# Patient Record
Sex: Female | Born: 1995 | Race: White | Hispanic: No | Marital: Single | Smoking: Never smoker
Health system: Southern US, Community
[De-identification: ages and names within clinical notes are randomized; demographics above are authoritative.]

## PROBLEM LIST (undated history)

## (undated) ENCOUNTER — Inpatient Hospital Stay (HOSPITAL_COMMUNITY): Payer: Self-pay

## (undated) DIAGNOSIS — F329 Major depressive disorder, single episode, unspecified: Secondary | ICD-10-CM

## (undated) DIAGNOSIS — F419 Anxiety disorder, unspecified: Secondary | ICD-10-CM

## (undated) DIAGNOSIS — R519 Headache, unspecified: Secondary | ICD-10-CM

## (undated) DIAGNOSIS — F32A Depression, unspecified: Secondary | ICD-10-CM

## (undated) DIAGNOSIS — J45909 Unspecified asthma, uncomplicated: Secondary | ICD-10-CM

## (undated) DIAGNOSIS — R51 Headache: Secondary | ICD-10-CM

## (undated) HISTORY — PX: WISDOM TOOTH EXTRACTION: SHX21

## (undated) HISTORY — DX: Anxiety disorder, unspecified: F41.9

## (undated) HISTORY — DX: Depression, unspecified: F32.A

## (undated) HISTORY — DX: Headache, unspecified: R51.9

## (undated) HISTORY — DX: Headache: R51

---
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
## 2013-04-08 NOTE — L&D Delivery Note (Signed)
Delivery Note  First Stage: Labor onset: 0500 Augmentation : pitocin Analgesia /Anesthesia intrapartum: epidural SROM at 0915  Second Stage: Complete dilation at 1136 Onset of pushing at 1145 FHR second stage 135 with few variables to 110  Delivery of a viable female at [id number] by CNM in ROA - ROP position no nuchal cord Cord double clamped after cessation of pulsation, cut by FOB Cord blood sample collected    Third [facility] @ 1248 Placenta disposition: disposal Uterine tone firm / bleeding small  Vaginal midline and right sulcus laceration identified  Anesthesia for repair: epidural Repair 3-0 vicrl vaginal locked repair Est. Blood Loss (mL): 300  Complications: none  Mom to postpartum.  Baby to Couplet care / Skin to Skin.  Newborn: Birth Weight: 8 pound 7 ounces Apgar Scores: 8-9 Feeding planned: breast  Marlinda Mikeanya Bailey CNM, MSN, [facility] 07/15/2013, 7:12 PM

## 2013-06-15 LAB — OB RESULTS CONSOLE GC/CHLAMYDIA
Chlamydia: NEGATIVE
Gonorrhea: NEGATIVE

## 2013-06-28 ENCOUNTER — Inpatient Hospital Stay (HOSPITAL_COMMUNITY)
Admission: AD | Admit: 2013-06-28 | Discharge: 2013-06-29 | Disposition: A | Discharge status: Home / Self Care | Payer: 59 | Source: Ambulatory Visit | Attending: Obstetrics & Gynecology | Admitting: Obstetrics & Gynecology

## 2013-06-28 ENCOUNTER — Encounter (HOSPITAL_COMMUNITY): Payer: Self-pay | Admitting: *Deleted

## 2013-06-28 ENCOUNTER — Inpatient Hospital Stay (HOSPITAL_COMMUNITY): Payer: 59

## 2013-06-28 DIAGNOSIS — O9A213 Injury, poisoning and certain other consequences of external causes complicating pregnancy, third trimester: Secondary | ICD-10-CM

## 2013-06-28 DIAGNOSIS — Y929 Unspecified place or not applicable: Secondary | ICD-10-CM | POA: Insufficient documentation

## 2013-06-28 DIAGNOSIS — W108XXA Fall (on) (from) other stairs and steps, initial encounter: Secondary | ICD-10-CM | POA: Insufficient documentation

## 2013-06-28 DIAGNOSIS — O479 False labor, unspecified: Secondary | ICD-10-CM | POA: Insufficient documentation

## 2013-06-28 DIAGNOSIS — O368190 Decreased fetal movements, unspecified trimester, not applicable or unspecified: Secondary | ICD-10-CM | POA: Insufficient documentation

## 2013-06-28 DIAGNOSIS — R109 Unspecified abdominal pain: Secondary | ICD-10-CM | POA: Insufficient documentation

## 2013-06-28 DIAGNOSIS — O99891 Other specified diseases and conditions complicating pregnancy: Secondary | ICD-10-CM | POA: Insufficient documentation

## 2013-06-28 DIAGNOSIS — O9989 Other specified diseases and conditions complicating pregnancy, childbirth and the puerperium: Secondary | ICD-10-CM

## 2013-06-28 MED ORDER — ACETAMINOPHEN 500 MG PO TABS
1000.0000 mg | ORAL_TABLET | Freq: Once | ORAL | Status: AC
  Administered 2013-06-28: 1000 mg via ORAL
  Filled 2013-06-28: qty 2

## 2013-06-28 NOTE — MAU Note (Signed)
Pt reports falling down stairs at about 2030, mostly on her bottom but also on right side. Denies bleeding, reports one fetal movement since fall. States she is having cramping now since the fall.

## 2013-06-28 NOTE — MAU Note (Signed)
PT SAYS SHE  FELL DOWN  3-4 STEPS TONIGHT -  CAT RAN BETWEEN  HER LEGS.     SHE LANDED TOWARD HER LEFT SIDE-.   VE IN OFFICE ON Thursday 1 CM.   DENIES HSV AND MRSA.

## 2013-06-28 NOTE — MAU Provider Note (Signed)
History     CSN: [id number]  Arrival date and time: 06/28/13 2132 Provider notified: 2210 Provider on unit: 2305 Provider at bedside: 2310     Chief Complaint  Patient presents with  . Fall  . Contractions   HPI  Ms. Emily Santos is a 18 yo female G1P0 at 37.[redacted] wks gestation presenting after a fall down 3-4 stairs d/t the   cat making her trip while on the stairs. She fell on her bottom, then hit her back and lastly landing on left side.  Denies VB or LOF. Some decreased FM immediately after the fall, but increased/normal FM shortly afterwards. She reports her contractions started earlier in the day every 10-12 minutes, stopped for a while, then started  back more frequently after the fall.    History reviewed. No pertinent past medical history.  Past Surgical History  Procedure Laterality Date  . Wisdom tooth extraction      History reviewed. No pertinent family history.  History  Substance Use Topics  . Smoking status: Former Games developermoker  . Smokeless tobacco: Not on file  . Alcohol Use: No    Allergies:  Allergies  Allergen Reactions  . Sulfa Antibiotics Rash    Prescriptions prior to admission  Medication Sig Dispense Refill  . acetaminophen (TYLENOL) 325 MG tablet Take 650 mg by mouth every 6 (six) hours as needed for headache.      . calcium carbonate (TUMS - DOSED IN MG ELEMENTAL CALCIUM) 500 MG chewable tablet Chew 2 tablets by mouth daily as needed for indigestion or heartburn.      . cetirizine (ZYRTEC) 10 MG tablet Take 10 mg by mouth daily.      . Multiple Vitamin (MULTIVITAMIN) tablet Take 1 tablet by mouth daily. Gummy prenatal vitamin      . ranitidine (ZANTAC) 150 MG capsule Take 150 mg by mouth 2 (two) times daily.        Review of Systems  Constitutional: Negative.   HENT: Negative.   Eyes: Negative.   Respiratory: Negative.   Cardiovascular: Negative.   Gastrointestinal: Negative.   Musculoskeletal: Positive for back pain.  Skin: Negative.    Neurological: Negative.   Endo/Heme/Allergies: Negative.   Psychiatric/Behavioral: Negative.    CEFM (Prolonged x 4 hours) FHR: 130 / moderate variability / accels present / rare variables TOC: irregular UC's every 3-8 mins / pt able to talk through 90% of contractions  OB Limited U/S Cephalic / FHR 126 bpm / AFI level 11.74 / Placenta posterior above cervical os - no evidence of an abruption seen / cervix not visualized transabdominally   Physical Exam   Blood pressure 129/75, pulse 92, temperature 98.3 F (36.8 C), temperature source Oral, resp. rate 18, height 5' 7.5" (1.715 m), weight 95.255 kg (210 lb), SpO2 99.00%.  Physical Exam  Constitutional: She is oriented to person, place, and time. She appears well-developed and well-nourished.  HENT:  Head: Normocephalic and atraumatic.  Eyes: Pupils are equal, round, and reactive to light.  Neck: Normal range of motion. Neck supple.  Cardiovascular: Normal rate, regular rhythm and normal heart sounds.   Respiratory: Effort normal and breath sounds normal.  GI: Soft. Bowel sounds are normal.  Genitourinary: Uterus normal.  Musculoskeletal: Normal range of motion.  Neurological: She is alert and oriented to person, place, and time.  Skin: Skin is warm and dry.  Psychiatric: She has a normal mood and affect. Her behavior is normal. Judgment and thought content normal.    MAU  Course  Procedures Prolonged EFM (4 hours) OB Limited U/S - check placenta Tylenol 1000 mg po Po hydration  Assessment and Plan  18 yo SIUP @ 37.[redacted] wks gestation S/P Fall in pregnancy  Discharge Home Bleeding Precautions Labor Precautions Fetal Kick Counts Keep scheduled appointment with [facility] on 06/30/13 Call the office prn  *Dr. Seymour Bars notified of assessment & plan - agrees  Kenard Gower, MSN, CNM 06/28/2013, 11:19 PM

## 2013-06-29 NOTE — Discharge Instructions (Signed)
Placental Abruption Placental abruption is when the placenta partially or completely separates from the uterus before the baby (fetus) is born. The placenta is the organ that provides nourishment to the baby. Normally, the placenta does not detach from the womb until after the baby is born. When it is large and separates before the baby is born, it may be a threat to the baby and mother's life. A small abruption may not be noticed until after the birth. Placenta abruption is uncommon. CAUSES  Often times, your caregiver will not know the cause. However, some uncommon causes include:   Abdominal injury.  Turning a baby that is presenting their buttocks first (breech) or is lying sideways in the uterus (transverse) to a headfirst position (external cephalic version).  Delivering the first twin.  Sudden loss of amniotic fluid (premature rupture of the membranes).  Abnormally short umbilical cord. SYMPTOMS  When the placental separation is small, it may not produce symptoms. There may be a small amount of belly (abdominal) pain or slight amount of vaginal bleeding.  Symptoms of severe problems will depend on the size of the separation and the stage of pregnancy. Symptoms may include:   Vaginal bleeding.  Uterine tenderness.  Fetal distress detected by fetal monitoring.  Severe abdominal pain with tenderness.  Continual uterine contraction (tetany).  Back pain.  Maternal shock with severe hemorrhage. RISK FACTORS  History of abruption.  High blood pressure.  Smoking and alcohol intake.  Blood clotting problems.  Too much fluid in the baby's sac (polyhydramnios).  Twins or more.  High blood pressure during pregnancy (preeclampsia) or seizures and convulsions (eclampsia).  Diabetes.  Having had more than four children.  Pregnancy in older women (54 years or older).  Illegal drugs.  Injury or trauma to the abdomen. PREVENTION  Prevention begins with good prenatal  care:  Stop using alcohol, illegal drugs and smoking.  Obey traffic laws and practice defensive driving.  Avoid dangerous activities such as snow and water skiing, horseback riding, motorcycles and mountain climbing.  Wear seat belts properly and at all times.  Control high blood pressure and diabetes.  Avoid situations where there is domestic violence. DIAGNOSIS  Placental abruption is suspected when a pregnant woman develops sudden uterine pain with or without bleeding. The uterus usually is very tender and hard. It may be enlarging because of the bleeding and the fetus may show signs of distress. Distress may show up as an abnormal heart rate or rhythm. When your caregiver sees these signs, they may do an ultrasound test to look for a clot behind the placenta. They will also do blood work to make sure there are not clotting problems, signs of too much blood loss, or not enough healthy red blood cells (anemia). These all require a blood transfusion. TREATMENT  Treatment depends on many things such as:   The amount of bleeding.  Distress with the baby or mother.  How far along the pregnancy is.  The maturity of the baby. This condition is usually an emergency. When the mother or fetus is in distress, it requires treatment right away to protect the safety of the mother and infant. If the baby is mature and delivery time is near:   Careful observation may allow the baby to be delivered vaginally. A vaginal birth is usually preferred over caesarean section unless there is fetal distress.  Sometimes, a caesarean section cannot be done if there are clotting problems or a DIC. If the symptoms are severe and  delivery is not about to happen:   A cesarean section may be done. This is an operation on the abdomen to remove the baby. If the symptoms are mild and there are no signs of distress with the baby or mother:   You may have to stay in the hospital for a couple of days for  observation.  You may be given steroid medication to get the baby's lungs mature when necessary.  If you are Rh negative and the father is Rh positive, you may get Rho-gam to prevent Rh problems in the baby.  When everything is ok and safe, you may go home and be placed on bed rest. HOME CARE INSTRUCTIONS   Take all medications as directed by your caregiver.  Keep all your follow-up prenatal visits.  Arrange for help at home before and after you deliver the baby, especially if you had a Cesarean section or a large amount of bleeding.  Get plenty of rest and sleep, especially after the baby is born.  Eat a nutritious and balanced diet.  Do not have sexual intercourse, use tampons or douche with out your caregiver's permission. SEEK IMMEDIATE MEDICAL CARE IF: Before delivery:  Any type of vaginal bleeding.  Abdominal pain  Continuous uterine contractions.  A hard, tender uterus.  You do not feel the baby move or the baby has very little movement. After delivery:  Started to pass large clots or pieces of tissue. This may be small pieces of placenta left following delivery.  Noticed that you are soaking more than one sanitary pad per hour, for several hours.  Heavy, bright-red bleeding which occurs four days or more after delivery.  A vaginal discharge which has a bad smell.  An unexplained oral temperature above 100 F (37.8 C).  Episodes of lightheadedness or fainting.  Shortness of breath or a rapid heartbeat with very little activity (exertion).  Abdominal pain.  Leg or chest pain. If you are having any of these symptoms, call your caregiver right away. Document Released: 03/25/2005 Document Revised: 06/17/2011 Document Reviewed: 07/14/2008 [facility] Patient Information 2014 [employer], [place]. Fetal Movement Counts Patient Name: __________________________________________________ Patient Due Date: ____________________ Performing a fetal movement count is highly  recommended in high-risk pregnancies, but it is good for every pregnant woman to do. Your caregiver may ask you to start counting fetal movements at 28 weeks of the pregnancy. Fetal movements often increase:  After eating a full meal.  After physical activity.  After eating or drinking something sweet or cold.  At rest. Pay attention to when you feel the baby is most active. This will help you notice a pattern of your baby's sleep and wake cycles and what factors contribute to an increase in fetal movement. It is important to perform a fetal movement count at the same time each day when your baby is normally most active.  HOW TO COUNT FETAL MOVEMENTS 1. Find a quiet and comfortable area to sit or lie down on your left side. Lying on your left side provides the best blood and oxygen circulation to your baby. 2. Write down the day and time on a sheet of paper or in a journal. 3. Start counting kicks, flutters, swishes, rolls, or jabs in a 2 hour period. You should feel at least 10 movements within 2 hours. 4. If you do not feel 10 movements in 2 hours, wait 2 3 hours and count again. Look for a change in the pattern or not enough counts in 2 hours.  SEEK MEDICAL CARE IF:  You feel less than 10 counts in 2 hours, tried twice.  There is no movement in over an hour.  The pattern is changing or taking longer each day to reach 10 counts in 2 hours.  You feel the baby is not moving as he or she usually does. Date: ____________ Movements: ____________ Start time: ____________ Doreatha Martin time: ____________  Date: ____________ Movements: ____________ Start time: ____________ Doreatha Martin time: ____________ Date: ____________ Movements: ____________ Start time: ____________ Doreatha Martin time: ____________ Date: ____________ Movements: ____________ Start time: ____________ Doreatha Martin time: ____________ Date: ____________ Movements: ____________ Start time: ____________ Doreatha Martin time: ____________ Date: ____________  Movements: ____________ Start time: ____________ Doreatha Martin time: ____________ Date: ____________ Movements: ____________ Start time: ____________ Doreatha Martin time: ____________ Date: ____________ Movements: ____________ Start time: ____________ Doreatha Martin time: ____________  Date: ____________ Movements: ____________ Start time: ____________ Doreatha Martin time: ____________ Date: ____________ Movements: ____________ Start time: ____________ Doreatha Martin time: ____________ Date: ____________ Movements: ____________ Start time: ____________ Doreatha Martin time: ____________ Date: ____________ Movements: ____________ Start time: ____________ Doreatha Martin time: ____________ Date: ____________ Movements: ____________ Start time: ____________ Doreatha Martin time: ____________ Date: ____________ Movements: ____________ Start time: ____________ Doreatha Martin time: ____________ Date: ____________ Movements: ____________ Start time: ____________ Doreatha Martin time: ____________  Date: ____________ Movements: ____________ Start time: ____________ Doreatha Martin time: ____________ Date: ____________ Movements: ____________ Start time: ____________ Doreatha Martin time: ____________ Date: ____________ Movements: ____________ Start time: ____________ Doreatha Martin time: ____________ Date: ____________ Movements: ____________ Start time: ____________ Doreatha Martin time: ____________ Date: ____________ Movements: ____________ Start time: ____________ Doreatha Martin time: ____________ Date: ____________ Movements: ____________ Start time: ____________ Doreatha Martin time: ____________ Date: ____________ Movements: ____________ Start time: ____________ Doreatha Martin time: ____________  Date: ____________ Movements: ____________ Start time: ____________ Doreatha Martin time: ____________ Date: ____________ Movements: ____________ Start time: ____________ Doreatha Martin time: ____________ Date: ____________ Movements: ____________ Start time: ____________ Doreatha Martin time: ____________ Date: ____________ Movements: ____________ Start time:  ____________ Doreatha Martin time: ____________ Date: ____________ Movements: ____________ Start time: ____________ Doreatha Martin time: ____________ Date: ____________ Movements: ____________ Start time: ____________ Doreatha Martin time: ____________ Date: ____________ Movements: ____________ Start time: ____________ Doreatha Martin time: ____________  Date: ____________ Movements: ____________ Start time: ____________ Doreatha Martin time: ____________ Date: ____________ Movements: ____________ Start time: ____________ Doreatha Martin time: ____________ Date: ____________ Movements: ____________ Start time: ____________ Doreatha Martin time: ____________ Date: ____________ Movements: ____________ Start time: ____________ Doreatha Martin time: ____________ Date: ____________ Movements: ____________ Start time: ____________ Doreatha Martin time: ____________ Date: ____________ Movements: ____________ Start time: ____________ Doreatha Martin time: ____________ Date: ____________ Movements: ____________ Start time: ____________ Doreatha Martin time: ____________  Date: ____________ Movements: ____________ Start time: ____________ Doreatha Martin time: ____________ Date: ____________ Movements: ____________ Start time: ____________ Doreatha Martin time: ____________ Date: ____________ Movements: ____________ Start time: ____________ Doreatha Martin time: ____________ Date: ____________ Movements: ____________ Start time: ____________ Doreatha Martin time: ____________ Date: ____________ Movements: ____________ Start time: ____________ Doreatha Martin time: ____________ Date: ____________ Movements: ____________ Start time: ____________ Doreatha Martin time: ____________ Date: ____________ Movements: ____________ Start time: ____________ Doreatha Martin time: ____________  Date: ____________ Movements: ____________ Start time: ____________ Doreatha Martin time: ____________ Date: ____________ Movements: ____________ Start time: ____________ Doreatha Martin time: ____________ Date: ____________ Movements: ____________ Start time: ____________ Doreatha Martin time: ____________ Date:  ____________ Movements: ____________ Start time: ____________ Doreatha Martin time: ____________ Date: ____________ Movements: ____________ Start time: ____________ Doreatha Martin time: ____________ Date: ____________ Movements: ____________ Start time: ____________ Doreatha Martin time: ____________ Date: ____________ Movements: ____________ Start time: ____________ Doreatha Martin time: ____________  Date: ____________ Movements: ____________ Start time: ____________ Doreatha Martin time: ____________ Date: ____________ Movements: ____________ Start time: ____________ Doreatha Martin time: ____________ Date: ____________ Movements: ____________ Start time: ____________ Doreatha Martin time: ____________ Date: ____________ Movements:  ____________ Start time: ____________ Doreatha Martin time: ____________ Date: ____________ Movements: ____________ Start time: ____________ Doreatha Martin time: ____________ Date: ____________ Movements: ____________ Start time: ____________ Doreatha Martin time: ____________ Document Released: 04/24/2006 Document Revised: 03/11/2012 Document Reviewed: 01/20/2012 [employer] Patient Information 2014 [place], [place]. Braxton Hicks Contractions Pregnancy is commonly associated with contractions of the uterus throughout the pregnancy. Towards the end of pregnancy (32 to 34 weeks), these contractions [facility] Willa Rough) can develop more often and may become more forceful. This is not true labor because these contractions do not result in opening (dilatation) and thinning of the cervix. They are sometimes difficult to tell apart from true labor because these contractions can be forceful and people have different pain tolerances. You should not feel embarrassed if you go to the hospital with false labor. Sometimes, the only way to tell if you are in true labor is for your caregiver to follow the changes in the cervix. How to tell the difference between true and false labor:  False labor.  The contractions of false labor are usually shorter, irregular and not as hard  as those of true labor.  They are often felt in the front of the lower abdomen and in the groin.  They may leave with walking around or changing positions while lying down.  They get weaker and are shorter lasting as time goes on.  These contractions are usually irregular.  They do not usually become progressively stronger, regular and closer together as with true labor.  True labor.  Contractions in true labor last 30 to 70 seconds, become very regular, usually become more intense, and increase in frequency.  They do not go away with walking.  The discomfort is usually felt in the top of the uterus and spreads to the lower abdomen and low back.  True labor can be determined by your caregiver with an exam. This will show that the cervix is dilating and getting thinner. If there are no prenatal problems or other health problems associated with the pregnancy, it is completely safe to be sent home with false labor and await the onset of true labor. HOME CARE INSTRUCTIONS   Keep up with your usual exercises and instructions.  Take medications as directed.  Keep your regular prenatal appointment.  Eat and drink lightly if you think you are going into labor.  If BH contractions are making you uncomfortable:  Change your activity position from lying down or resting to walking/walking to resting.  Sit and rest in a tub of warm water.  Drink 2 to 3 glasses of water. Dehydration may cause B-H contractions.  Do slow and deep breathing several times an hour. SEEK IMMEDIATE MEDICAL CARE IF:   Your contractions continue to become stronger, more regular, and closer together.  You have a gushing, burst or leaking of fluid from the vagina.  An oral temperature above 102 F (38.9 C) develops.  You have passage of blood-tinged mucus.  You develop vaginal bleeding.  You develop continuous belly (abdominal) pain.  You have low back pain that you never had before.  You feel the  baby's head pushing down causing pelvic pressure.  The baby is not moving as much as it used to. Document Released: 03/25/2005 Document Revised: 06/17/2011 Document Reviewed: 01/04/2013 [facility] Patient Information 2014 [place], [place].

## 2013-07-04 ENCOUNTER — Encounter (HOSPITAL_COMMUNITY): Payer: Self-pay | Admitting: Family

## 2013-07-04 ENCOUNTER — Inpatient Hospital Stay (HOSPITAL_COMMUNITY)
Admission: AD | Admit: 2013-07-04 | Discharge: 2013-07-04 | Disposition: A | Discharge status: Home / Self Care | Payer: 59 | Source: Ambulatory Visit | Attending: Obstetrics and Gynecology | Admitting: Obstetrics and Gynecology

## 2013-07-04 DIAGNOSIS — O471 False labor at or after 37 completed weeks of gestation: Secondary | ICD-10-CM

## 2013-07-04 DIAGNOSIS — O479 False labor, unspecified: Secondary | ICD-10-CM | POA: Insufficient documentation

## 2013-07-04 LAB — URINALYSIS, ROUTINE W REFLEX MICROSCOPIC
Bilirubin Urine: NEGATIVE
Glucose, UA: NEGATIVE mg/dL
Hgb urine dipstick: NEGATIVE
Ketones, ur: NEGATIVE mg/dL
Leukocytes, UA: NEGATIVE
Nitrite: NEGATIVE
Protein, ur: NEGATIVE mg/dL
Specific Gravity, Urine: 1.015 (ref 1.005–1.030)
Urobilinogen, UA: 0.2 mg/dL (ref 0.0–1.0)
pH: 6.5 (ref 5.0–8.0)

## 2013-07-04 LAB — POCT FERN TEST: POCT Fern Test: NEGATIVE

## 2013-07-04 NOTE — MAU Note (Signed)
18 yo, G1P0 at 1341w4d, presents to MAU with c/o intermittent lower abdominal cramping since 1400 today. Reports clear vaginal fluid noted today when she woke up today, changed underwear 3 times throughout the day.  Denies VB. Reports +FM.

## 2013-07-04 NOTE — Discharge Instructions (Signed)
Braxton Hicks Contractions Pregnancy is commonly associated with contractions of the uterus throughout the pregnancy. Towards the end of pregnancy (32 to 34 weeks), these contractions (Braxton Hicks) can develop more often and may become more forceful. This is not true labor because these contractions do not result in opening (dilatation) and thinning of the cervix. They are sometimes difficult to tell apart from true labor because these contractions can be forceful and people have different pain tolerances. You should not feel embarrassed if you go to the hospital with false labor. Sometimes, the only way to tell if you are in true labor is for your caregiver to follow the changes in the cervix. How to tell the difference between true and false labor:  False labor.  The contractions of false labor are usually shorter, irregular and not as hard as those of true labor.  They are often felt in the front of the lower abdomen and in the groin.  They may leave with walking around or changing positions while lying down.  They get weaker and are shorter lasting as time goes on.  These contractions are usually irregular.  They do not usually become progressively stronger, regular and closer together as with true labor.  True labor.  Contractions in true labor last 30 to 70 seconds, become very regular, usually become more intense, and increase in frequency.  They do not go away with walking.  The discomfort is usually felt in the top of the uterus and spreads to the lower abdomen and low back.  True labor can be determined by your caregiver with an exam. This will show that the cervix is dilating and getting thinner. If there are no prenatal problems or other health problems associated with the pregnancy, it is completely safe to be sent home with false labor and await the onset of true labor. HOME CARE INSTRUCTIONS   Keep up with your usual exercises and instructions.  Take medications as  directed.  Keep your regular prenatal appointment.  Eat and drink lightly if you think you are going into labor.  If BH contractions are making you uncomfortable:  Change your activity position from lying down or resting to walking/walking to resting.  Sit and rest in a tub of warm water.  Drink 2 to 3 glasses of water. Dehydration may cause B-H contractions.  Do slow and deep breathing several times an hour. SEEK IMMEDIATE MEDICAL CARE IF:   Your contractions continue to become stronger, more regular, and closer together.  You have a gushing, burst or leaking of fluid from the vagina.  An oral temperature above 102 F (38.9 C) develops.  You have passage of blood-tinged mucus.  You develop vaginal bleeding.  You develop continuous belly (abdominal) pain.  You have low back pain that you never had before.  You feel the baby's head pushing down causing pelvic pressure.  The baby is not moving as much as it used to. Document Released: 03/25/2005 Document Revised: 06/17/2011 Document Reviewed: 01/04/2013 Exit[employer] Patient Information 2014 [employer].  Fetal Movement Counts Patient Name: __________________________________________________ Patient Due Date: ____________________ Performing a fetal movement count is highly recommended in high-risk pregnancies, but it is good for every pregnant woman to do. Your caregiver may ask you to start counting fetal movements at 28 weeks of the pregnancy. Fetal movements often increase:  After eating a full meal.  After physical activity.  After eating or drinking something sweet or cold.  At rest. Pay attention to when you feel   the baby is most active. This will help you notice a pattern of your baby's sleep and wake cycles and what factors contribute to an increase in fetal movement. It is important to perform a fetal movement count at the same time each day when your baby is normally most active.  HOW TO COUNT FETAL  MOVEMENTS 1. Find a quiet and comfortable area to sit or lie down on your left side. Lying on your left side provides the best blood and oxygen circulation to your baby. 2. Write down the day and time on a sheet of paper or in a journal. 3. Start counting kicks, flutters, swishes, rolls, or jabs in a 2 hour period. You should feel at least 10 movements within 2 hours. 4. If you do not feel 10 movements in 2 hours, wait 2 3 hours and count again. Look for a change in the pattern or not enough counts in 2 hours. SEEK MEDICAL CARE IF:  You feel less than 10 counts in 2 hours, tried twice.  There is no movement in over an hour.  The pattern is changing or taking longer each day to reach 10 counts in 2 hours.  You feel the baby is not moving as he or she usually does. Date: ____________ Movements: ____________ Start time: ____________ Finish time: ____________  Date: ____________ Movements: ____________ Start time: ____________ Finish time: ____________ Date: ____________ Movements: ____________ Start time: ____________ Finish time: ____________ Date: ____________ Movements: ____________ Start time: ____________ Finish time: ____________ Date: ____________ Movements: ____________ Start time: ____________ Finish time: ____________ Date: ____________ Movements: ____________ Start time: ____________ Finish time: ____________ Date: ____________ Movements: ____________ Start time: ____________ Finish time: ____________ Date: ____________ Movements: ____________ Start time: ____________ Finish time: ____________  Date: ____________ Movements: ____________ Start time: ____________ Finish time: ____________ Date: ____________ Movements: ____________ Start time: ____________ Finish time: ____________ Date: ____________ Movements: ____________ Start time: ____________ Finish time: ____________ Date: ____________ Movements: ____________ Start time: ____________ Finish time: ____________ Date: ____________  Movements: ____________ Start time: ____________ Finish time: ____________ Date: ____________ Movements: ____________ Start time: ____________ Finish time: ____________ Date: ____________ Movements: ____________ Start time: ____________ Finish time: ____________  Date: ____________ Movements: ____________ Start time: ____________ Finish time: ____________ Date: ____________ Movements: ____________ Start time: ____________ Finish time: ____________ Date: ____________ Movements: ____________ Start time: ____________ Finish time: ____________ Date: ____________ Movements: ____________ Start time: ____________ Finish time: ____________ Date: ____________ Movements: ____________ Start time: ____________ Finish time: ____________ Date: ____________ Movements: ____________ Start time: ____________ Finish time: ____________ Date: ____________ Movements: ____________ Start time: ____________ Finish time: ____________  Date: ____________ Movements: ____________ Start time: ____________ Finish time: ____________ Date: ____________ Movements: ____________ Start time: ____________ Finish time: ____________ Date: ____________ Movements: ____________ Start time: ____________ Finish time: ____________ Date: ____________ Movements: ____________ Start time: ____________ Finish time: ____________ Date: ____________ Movements: ____________ Start time: ____________ Finish time: ____________ Date: ____________ Movements: ____________ Start time: ____________ Finish time: ____________ Date: ____________ Movements: ____________ Start time: ____________ Finish time: ____________  Date: ____________ Movements: ____________ Start time: ____________ Finish time: ____________ Date: ____________ Movements: ____________ Start time: ____________ Finish time: ____________ Date: ____________ Movements: ____________ Start time: ____________ Finish time: ____________ Date: ____________ Movements: ____________ Start time:  ____________ Finish time: ____________ Date: ____________ Movements: ____________ Start time: ____________ Finish time: ____________ Date: ____________ Movements: ____________ Start time: ____________ Finish time: ____________ Date: ____________ Movements: ____________ Start time: ____________ Finish time: ____________  Date: ____________ Movements: ____________ Start time: ____________ Finish time: ____________ Date: ____________ Movements: ____________ Start   time: ____________ Finish time: ____________ Date: ____________ Movements: ____________ Start time: ____________ Finish time: ____________ Date: ____________ Movements: ____________ Start time: ____________ Finish time: ____________ Date: ____________ Movements: ____________ Start time: ____________ Finish time: ____________ Date: ____________ Movements: ____________ Start time: ____________ Finish time: ____________ Date: ____________ Movements: ____________ Start time: ____________ Finish time: ____________  Date: ____________ Movements: ____________ Start time: ____________ Finish time: ____________ Date: ____________ Movements: ____________ Start time: ____________ Finish time: ____________ Date: ____________ Movements: ____________ Start time: ____________ Finish time: ____________ Date: ____________ Movements: ____________ Start time: ____________ Finish time: ____________ Date: ____________ Movements: ____________ Start time: ____________ Finish time: ____________ Date: ____________ Movements: ____________ Start time: ____________ Finish time: ____________ Date: ____________ Movements: ____________ Start time: ____________ Finish time: ____________  Date: ____________ Movements: ____________ Start time: ____________ Finish time: ____________ Date: ____________ Movements: ____________ Start time: ____________ Finish time: ____________ Date: ____________ Movements: ____________ Start time: ____________ Finish time: ____________ Date:  ____________ Movements: ____________ Start time: ____________ Finish time: ____________ Date: ____________ Movements: ____________ Start time: ____________ Finish time: ____________ Date: ____________ Movements: ____________ Start time: ____________ Finish time: ____________ Document Released: 04/24/2006 Document Revised: 03/11/2012 Document Reviewed: 01/20/2012 [employer] Patient Information 2014 [employer].  

## 2013-07-04 NOTE — MAU Note (Signed)
Pt presents with complaints of contractions on and off for a couple of weeks, but noticed today that she had a liquid discharge and thinks maybe her water is broke.

## 2013-07-09 ENCOUNTER — Encounter (HOSPITAL_COMMUNITY): Payer: Self-pay | Admitting: *Deleted

## 2013-07-09 ENCOUNTER — Inpatient Hospital Stay (HOSPITAL_COMMUNITY)
Admission: AD | Admit: 2013-07-09 | Discharge: 2013-07-09 | Disposition: A | Discharge status: Home / Self Care | Payer: 59 | Source: Ambulatory Visit | Attending: Obstetrics and Gynecology | Admitting: Obstetrics and Gynecology

## 2013-07-09 DIAGNOSIS — Z87891 Personal history of nicotine dependence: Secondary | ICD-10-CM | POA: Insufficient documentation

## 2013-07-09 DIAGNOSIS — IMO0002 Reserved for concepts with insufficient information to code with codable children: Secondary | ICD-10-CM | POA: Insufficient documentation

## 2013-07-09 DIAGNOSIS — R030 Elevated blood-pressure reading, without diagnosis of hypertension: Secondary | ICD-10-CM | POA: Insufficient documentation

## 2013-07-09 DIAGNOSIS — R109 Unspecified abdominal pain: Secondary | ICD-10-CM | POA: Insufficient documentation

## 2013-07-09 DIAGNOSIS — R609 Edema, unspecified: Secondary | ICD-10-CM

## 2013-07-09 DIAGNOSIS — O9989 Other specified diseases and conditions complicating pregnancy, childbirth and the puerperium: Secondary | ICD-10-CM

## 2013-07-09 DIAGNOSIS — O99891 Other specified diseases and conditions complicating pregnancy: Secondary | ICD-10-CM | POA: Insufficient documentation

## 2013-07-09 LAB — COMPREHENSIVE METABOLIC PANEL
ALT: 11 U/L (ref 0–35)
AST: 21 U/L (ref 0–37)
Albumin: 2.6 g/dL — ABNORMAL LOW (ref 3.5–5.2)
Alkaline Phosphatase: 135 U/L — ABNORMAL HIGH (ref 39–117)
BUN: 8 mg/dL (ref 6–23)
CO2: 20 mEq/L (ref 19–32)
Calcium: 8.9 mg/dL (ref 8.4–10.5)
Chloride: 98 mEq/L (ref 96–112)
Creatinine, Ser: 0.66 mg/dL (ref 0.50–1.10)
GFR calc Af Amer: >90 mL/min (ref >90)
GFR calc non Af Amer: >90 mL/min (ref >90)
Glucose, Bld: 78 mg/dL (ref 70–99)
Potassium: 4.4 mEq/L (ref 3.7–5.3)
Sodium: 132 mEq/L — ABNORMAL LOW (ref 137–147)
Total Bilirubin: 0.3 mg/dL (ref 0.3–1.2)
Total Protein: 6.5 g/dL (ref 6.0–8.3)

## 2013-07-09 LAB — URINALYSIS, DIPSTICK ONLY
Bilirubin Urine: NEGATIVE
Glucose, UA: NEGATIVE mg/dL
Hgb urine dipstick: NEGATIVE
Ketones, ur: NEGATIVE mg/dL
Leukocytes, UA: NEGATIVE
Nitrite: NEGATIVE
Protein, ur: NEGATIVE mg/dL
Specific Gravity, Urine: 1.025 (ref 1.005–1.030)
Urobilinogen, UA: 0.2 mg/dL (ref 0.0–1.0)
pH: 6.5 (ref 5.0–8.0)

## 2013-07-09 LAB — CBC
HCT: 36.6 % (ref 36.0–46.0)
Hemoglobin: 11.9 g/dL — ABNORMAL LOW (ref 12.0–15.0)
MCH: 29.5 pg (ref 26.0–34.0)
MCHC: 32.5 g/dL (ref 30.0–36.0)
MCV: 90.8 fL (ref 78.0–100.0)
Platelets: 253 10*3/uL (ref 150–400)
RBC: 4.03 MIL/uL (ref 3.87–5.11)
RDW: 14.1 % (ref 11.5–15.5)
WBC: 9.5 10*3/uL (ref 4.0–10.5)

## 2013-07-09 LAB — URIC ACID: Uric Acid, Serum: 6.6 mg/dL (ref 2.4–7.0)

## 2013-07-09 NOTE — MAU Note (Signed)
Patient states she started swelling in her hands and face this am but had swelling in her feet since yesterday but getting worse. Has some lower abdominal and back pain, not sure if contractions. Denies bleeding and has a little discharge. Reports good fetal movement.

## 2013-07-09 NOTE — Discharge Instructions (Signed)

## 2013-07-09 NOTE — MAU Provider Note (Signed)
  History    CSN: [id number]  Arrival date and time: 07/09/13 1200 Call to provider @ 1251 Provider at bedside @ 1300  Chief Complaint  Patient presents with  . Leg Swelling  . Facial Swelling  . Abdominal Pain   HPI  Reports elevated BP at home 140/90 with swelling in legs/hands/face Some contractions but nothing regular No headache or vision changes Active FM  History reviewed. No pertinent past medical history.  Past Surgical History  Procedure Laterality Date  . Wisdom tooth extraction     History reviewed. No pertinent family history.  History  Substance Use Topics  . Smoking status: Former Games developermoker  . Smokeless tobacco: Not on file  . Alcohol Use: No   Allergies:  Allergies  Allergen Reactions  . Sulfa Antibiotics Rash    Prescriptions prior to admission  Medication Sig Dispense Refill  . acetaminophen (TYLENOL) 325 MG tablet Take 650 mg by mouth every 6 (six) hours as needed for headache.      . calcium carbonate (TUMS - DOSED IN MG ELEMENTAL CALCIUM) 500 MG chewable tablet Chew 2 tablets by mouth daily as needed for indigestion or heartburn.      . cetirizine (ZYRTEC) 10 MG tablet Take 10 mg by mouth daily.      . diphenhydrAMINE (BENADRYL) 2 % cream Apply 1 application topically 3 (three) times daily as needed for itching.      . Doxylamine-Pyridoxine (DICLEGIS) 10-10 MG TBEC Take 1 tablet by mouth as needed (nausea).      Jobe Gibbon. EVENING PRIMROSE OIL PO Take 1 capsule by mouth 2 (two) times daily.      . Multiple Vitamin (MULTIVITAMIN) tablet Take 1 tablet by mouth daily. Gummy prenatal vitamin      . ranitidine (ZANTAC) 150 MG capsule Take 150 mg by mouth 2 (two) times daily.       ROS Physical Exam   Blood pressure 115/79, pulse 87, temperature 98.9 F (37.2 C), resp. rate 16, height 5\' 6"  (1.676 m), weight 96.707 kg (213 lb 3.2 oz), SpO2 97.00%.  Physical Exam Pleasant NAD or pain Abdomen soft and non-tender VE: soft / loose 1cm / 60% / vtx  -2 Extremities: DTR 2+ / dependent edema 2+  Serial BP : 115/79  120/67  146/74  123/79  130/80  MAU Course  Procedures NST - reactive  PIH labs: normal   Assessment and Plan  39.[redacted] weeks pregnant Dependent edema DC home - labor precautions Keep OV next week   Marlinda MikeBAILEY, Millette Halberstam 07/09/2013, 12:59 PM

## 2013-07-14 ENCOUNTER — Inpatient Hospital Stay (HOSPITAL_COMMUNITY)
Admission: RE | Admit: 2013-07-14 | Discharge: 2013-07-17 | DRG: 775 | Disposition: A | Discharge status: Home / Self Care | Payer: 59 | Source: Ambulatory Visit | Attending: Obstetrics | Admitting: Obstetrics

## 2013-07-14 ENCOUNTER — Telehealth (HOSPITAL_COMMUNITY): Payer: Self-pay | Admitting: *Deleted

## 2013-07-14 ENCOUNTER — Encounter (HOSPITAL_COMMUNITY): Payer: Self-pay | Admitting: *Deleted

## 2013-07-14 DIAGNOSIS — O99892 Other specified diseases and conditions complicating childbirth: Secondary | ICD-10-CM | POA: Diagnosis present

## 2013-07-14 DIAGNOSIS — IMO0002 Reserved for concepts with insufficient information to code with codable children: Secondary | ICD-10-CM | POA: Diagnosis present

## 2013-07-14 DIAGNOSIS — Z22330 Carrier of Group B streptococcus: Secondary | ICD-10-CM | POA: Diagnosis not present

## 2013-07-14 DIAGNOSIS — D649 Anemia, unspecified: Secondary | ICD-10-CM | POA: Diagnosis not present

## 2013-07-14 DIAGNOSIS — O9989 Other specified diseases and conditions complicating pregnancy, childbirth and the puerperium: Secondary | ICD-10-CM

## 2013-07-14 DIAGNOSIS — O9903 Anemia complicating the puerperium: Secondary | ICD-10-CM | POA: Diagnosis not present

## 2013-07-14 DIAGNOSIS — Z3490 Encounter for supervision of normal pregnancy, unspecified, unspecified trimester: Secondary | ICD-10-CM

## 2013-07-14 DIAGNOSIS — K219 Gastro-esophageal reflux disease without esophagitis: Secondary | ICD-10-CM | POA: Diagnosis present

## 2013-07-14 DIAGNOSIS — O480 Post-term pregnancy: Secondary | ICD-10-CM | POA: Diagnosis present

## 2013-07-14 LAB — CBC
HCT: 36.5 % (ref 36.0–46.0)
Hemoglobin: 12.3 g/dL (ref 12.0–15.0)
MCH: 30.8 pg (ref 26.0–34.0)
MCHC: 33.7 g/dL (ref 30.0–36.0)
MCV: 91.3 fL (ref 78.0–100.0)
Platelets: 269 10*3/uL (ref 150–400)
RBC: 4 MIL/uL (ref 3.87–5.11)
RDW: 14 % (ref 11.5–15.5)
WBC: 10.6 10*3/uL — ABNORMAL HIGH (ref 4.0–10.5)

## 2013-07-14 MED ORDER — FLEET ENEMA 7-19 GM/118ML RE ENEM: 1.0000 | ENEMA | RECTAL | Status: DC | PRN

## 2013-07-14 MED ORDER — LIDOCAINE HCL (PF) 1 % IJ SOLN
30.0000 mL | INTRAMUSCULAR | Status: AC | PRN
  Administered 2013-07-15: 7 mL via SUBCUTANEOUS
  Administered 2013-07-15: 3 mL via SUBCUTANEOUS
  Filled 2013-07-14: qty 30

## 2013-07-14 MED ORDER — CITRIC ACID-SODIUM CITRATE 334-500 MG/5ML PO SOLN: 30.0000 mL | ORAL | Status: DC | PRN

## 2013-07-14 MED ORDER — LACTATED RINGERS IV SOLN
INTRAVENOUS | Status: DC
  Administered 2013-07-15 (×3): via INTRAVENOUS

## 2013-07-14 MED ORDER — TERBUTALINE SULFATE 1 MG/ML IJ SOLN: 0.2500 mg | Freq: Once | INTRAMUSCULAR | Status: AC | PRN

## 2013-07-14 MED ORDER — ACETAMINOPHEN 325 MG PO TABS: 650.0000 mg | ORAL_TABLET | ORAL | Status: DC | PRN

## 2013-07-14 MED ORDER — ONDANSETRON HCL 4 MG/2ML IJ SOLN
4.0000 mg | Freq: Four times a day (QID) | INTRAMUSCULAR | Status: DC | PRN
  Administered 2013-07-15: 4 mg via INTRAVENOUS
  Filled 2013-07-14: qty 2

## 2013-07-14 MED ORDER — ZOLPIDEM TARTRATE 5 MG PO TABS
5.0000 mg | ORAL_TABLET | Freq: Every evening | ORAL | Status: DC | PRN
  Administered 2013-07-15: 5 mg via ORAL
  Filled 2013-07-14: qty 1

## 2013-07-14 MED ORDER — OXYCODONE-ACETAMINOPHEN 5-325 MG PO TABS: 1.0000 | ORAL_TABLET | ORAL | Status: DC | PRN

## 2013-07-14 MED ORDER — IBUPROFEN 600 MG PO TABS
600.0000 mg | ORAL_TABLET | Freq: Four times a day (QID) | ORAL | Status: DC | PRN
  Administered 2013-07-15: 600 mg via ORAL
  Filled 2013-07-14: qty 1

## 2013-07-14 MED ORDER — OXYTOCIN 40 UNITS IN LACTATED RINGERS INFUSION - SIMPLE MED
62.5000 mL/h | INTRAVENOUS | Status: DC
  Administered 2013-07-15: 999 mL/h via INTRAVENOUS

## 2013-07-14 MED ORDER — MISOPROSTOL 25 MCG QUARTER TABLET
25.0000 ug | ORAL_TABLET | ORAL | Status: DC | PRN
  Administered 2013-07-14: 25 ug via VAGINAL
  Filled 2013-07-14: qty 0.25

## 2013-07-14 MED ORDER — OXYTOCIN BOLUS FROM INFUSION
500.0000 mL | INTRAVENOUS | Status: DC
Start: 2013-07-14 — End: 2013-07-15

## 2013-07-14 MED ORDER — LACTATED RINGERS IV SOLN
500.0000 mL | INTRAVENOUS | Status: DC | PRN
  Administered 2013-07-15: 1000 mL via INTRAVENOUS

## 2013-07-14 NOTE — Telephone Encounter (Signed)
Preadmission screen  

## 2013-07-15 ENCOUNTER — Encounter (HOSPITAL_COMMUNITY): Payer: Self-pay

## 2013-07-15 ENCOUNTER — Encounter (HOSPITAL_COMMUNITY): Payer: 59 | Admitting: Anesthesiology

## 2013-07-15 ENCOUNTER — Inpatient Hospital Stay (HOSPITAL_COMMUNITY): Payer: 59 | Admitting: Anesthesiology

## 2013-07-15 DIAGNOSIS — O480 Post-term pregnancy: Secondary | ICD-10-CM | POA: Diagnosis present

## 2013-07-15 LAB — RPR

## 2013-07-15 MED ORDER — SIMETHICONE 80 MG PO CHEW: 80.0000 mg | CHEWABLE_TABLET | ORAL | Status: DC | PRN

## 2013-07-15 MED ORDER — WITCH HAZEL-GLYCERIN EX PADS: 1.0000 "application " | MEDICATED_PAD | CUTANEOUS | Status: DC | PRN

## 2013-07-15 MED ORDER — DIPHENHYDRAMINE HCL 50 MG/ML IJ SOLN
12.5000 mg | INTRAMUSCULAR | Status: DC | PRN
Start: 2013-07-15 — End: 2013-07-15

## 2013-07-15 MED ORDER — PHENYLEPHRINE 40 MCG/ML (10ML) SYRINGE FOR IV PUSH (FOR BLOOD PRESSURE SUPPORT)
80.0000 ug | PREFILLED_SYRINGE | INTRAVENOUS | Status: DC | PRN
  Filled 2013-07-15: qty 10

## 2013-07-15 MED ORDER — IBUPROFEN 600 MG PO TABS
600.0000 mg | ORAL_TABLET | Freq: Four times a day (QID) | ORAL | Status: DC
  Administered 2013-07-15 – 2013-07-17 (×7): 600 mg via ORAL
  Filled 2013-07-15 (×7): qty 1

## 2013-07-15 MED ORDER — SENNOSIDES-DOCUSATE SODIUM 8.6-50 MG PO TABS
2.0000 | ORAL_TABLET | ORAL | Status: DC
  Administered 2013-07-15: 2 via ORAL
  Filled 2013-07-15: qty 2

## 2013-07-15 MED ORDER — HYDROCORTISONE ACE-PRAMOXINE 1-1 % RE CREA: TOPICAL_CREAM | Freq: Three times a day (TID) | RECTAL | Status: DC

## 2013-07-15 MED ORDER — PROMETHAZINE HCL 25 MG/ML IJ SOLN
12.5000 mg | INTRAMUSCULAR | Status: AC
  Administered 2013-07-15: 12.5 mg via INTRAVENOUS
  Filled 2013-07-15: qty 1

## 2013-07-15 MED ORDER — FENTANYL 2.5 MCG/ML BUPIVACAINE 1/10 % EPIDURAL INFUSION (WH - ANES)
14.0000 mL/h | INTRAMUSCULAR | Status: DC | PRN
  Administered 2013-07-15: 14 mL/h via EPIDURAL

## 2013-07-15 MED ORDER — EPHEDRINE 5 MG/ML INJ: 10.0000 mg | INTRAVENOUS | Status: DC | PRN

## 2013-07-15 MED ORDER — PHENYLEPHRINE 40 MCG/ML (10ML) SYRINGE FOR IV PUSH (FOR BLOOD PRESSURE SUPPORT): 80.0000 ug | PREFILLED_SYRINGE | INTRAVENOUS | Status: DC | PRN

## 2013-07-15 MED ORDER — FENTANYL 2.5 MCG/ML BUPIVACAINE 1/10 % EPIDURAL INFUSION (WH - ANES)
14.0000 mL/h | INTRAMUSCULAR | Status: DC | PRN
  Filled 2013-07-15: qty 125

## 2013-07-15 MED ORDER — OXYCODONE-ACETAMINOPHEN 5-325 MG PO TABS: 1.0000 | ORAL_TABLET | ORAL | Status: DC | PRN

## 2013-07-15 MED ORDER — BUTORPHANOL TARTRATE 1 MG/ML IJ SOLN
2.0000 mg | Freq: Once | INTRAMUSCULAR | Status: AC
  Administered 2013-07-15: 2 mg via INTRAVENOUS
  Filled 2013-07-15 (×2): qty 2

## 2013-07-15 MED ORDER — PENICILLIN G POTASSIUM 5000000 UNITS IJ SOLR
5.0000 10*6.[IU] | Freq: Once | INTRAVENOUS | Status: AC
  Administered 2013-07-15: 5 10*6.[IU] via INTRAVENOUS
  Filled 2013-07-15: qty 5

## 2013-07-15 MED ORDER — HYDROCORTISONE ACE-PRAMOXINE 1-1 % RE FOAM: 1.0000 | Freq: Three times a day (TID) | RECTAL | Status: DC

## 2013-07-15 MED ORDER — LACTATED RINGERS IV SOLN: 500.0000 mL | Freq: Once | INTRAVENOUS | Status: DC

## 2013-07-15 MED ORDER — FAMOTIDINE 20 MG PO TABS
20.0000 mg | ORAL_TABLET | Freq: Every day | ORAL | Status: DC
  Administered 2013-07-16 – 2013-07-17 (×2): 20 mg via ORAL
  Filled 2013-07-15 (×2): qty 1

## 2013-07-15 MED ORDER — DIBUCAINE 1 % RE OINT
1.0000 "application " | TOPICAL_OINTMENT | RECTAL | Status: DC | PRN
  Filled 2013-07-15: qty 28

## 2013-07-15 MED ORDER — BENZOCAINE-MENTHOL 20-0.5 % EX AERO
1.0000 "application " | INHALATION_SPRAY | CUTANEOUS | Status: DC | PRN
  Administered 2013-07-15: 1 via TOPICAL
  Filled 2013-07-15: qty 56

## 2013-07-15 MED ORDER — DIPHENHYDRAMINE HCL 25 MG PO CAPS: 25.0000 mg | ORAL_CAPSULE | Freq: Four times a day (QID) | ORAL | Status: DC | PRN

## 2013-07-15 MED ORDER — LANOLIN HYDROUS EX OINT: TOPICAL_OINTMENT | CUTANEOUS | Status: DC | PRN

## 2013-07-15 MED ORDER — PENICILLIN G POTASSIUM 5000000 UNITS IJ SOLR
2.5000 10*6.[IU] | INTRAMUSCULAR | Status: DC
  Administered 2013-07-15 (×2): 2.5 10*6.[IU] via INTRAVENOUS
  Filled 2013-07-15 (×7): qty 2.5

## 2013-07-15 MED ORDER — OXYTOCIN 40 UNITS IN LACTATED RINGERS INFUSION - SIMPLE MED
1.0000 m[IU]/min | INTRAVENOUS | Status: DC
Start: 2013-07-15 — End: 2013-07-15
  Administered 2013-07-15: 2 m[IU]/min via INTRAVENOUS
  Filled 2013-07-15: qty 1000

## 2013-07-15 MED ORDER — DIPHENHYDRAMINE HCL 50 MG/ML IJ SOLN: 12.5000 mg | INTRAMUSCULAR | Status: DC | PRN

## 2013-07-15 MED ORDER — HYDROCHLOROTHIAZIDE 25 MG PO TABS
25.0000 mg | ORAL_TABLET | Freq: Every day | ORAL | Status: DC
  Administered 2013-07-15 – 2013-07-17 (×3): 25 mg via ORAL
  Filled 2013-07-15 (×4): qty 1

## 2013-07-15 MED ORDER — LORATADINE 10 MG PO TABS
10.0000 mg | ORAL_TABLET | Freq: Every day | ORAL | Status: DC
  Administered 2013-07-15 – 2013-07-17 (×3): 10 mg via ORAL
  Filled 2013-07-15 (×4): qty 1

## 2013-07-15 NOTE — Anesthesia Preprocedure Evaluation (Signed)
Anesthesia Evaluation  Patient identified by MRN, date of birth, ID band Patient awake    Airway Mallampati: II TM Distance: >3 FB Neck ROM: Full    Dental  (+) Teeth Intact   Pulmonary neg pulmonary ROS,  breath sounds clear to auscultation        Cardiovascular negative cardio ROS  Rhythm:Regular     Neuro/Psych negative neurological ROS     GI/Hepatic Neg liver ROS, GERD-  Medicated and Controlled,  Endo/Other  negative endocrine ROS  Renal/GU negative Renal ROS     Musculoskeletal negative musculoskeletal ROS (+)   Abdominal   Peds  Hematology negative hematology ROS (+)   Anesthesia Other Findings   Reproductive/Obstetrics (+) Pregnancy                           Anesthesia Physical Anesthesia Plan  ASA: II  Anesthesia Plan: Epidural   Post-op Pain Management:    Induction:   Airway Management Planned:   Additional Equipment:   Intra-op Plan:   Post-operative Plan:   Informed Consent: I have reviewed the patients History and Physical, chart, labs and discussed the procedure including the risks, benefits and alternatives for the proposed anesthesia with the patient or authorized representative who has indicated his/her understanding and acceptance.   Dental advisory given  Plan Discussed with: Anesthesiologist  Anesthesia Plan Comments:         Anesthesia Quick Evaluation

## 2013-07-15 NOTE — Progress Notes (Signed)
S:  Comfortable since epidural       Leaking water x 10 minutes - SROM after epidural  O:  VS: Blood pressure 109/67, pulse 89, temperature 98.3 F (36.8 C), temperature source Oral, resp. rate 18, height 5\' 8"  (1.727 m), weight 97.977 kg (216 lb), SpO2 100.00%.        FHR : baseline 125 / variability moderate / accelerations + / no decelerations        Toco: contractions every 2-4 minutes / pitocin 10 mu/min         Cervix : 7/90/vtx/ 0        Membranes: clear fluid        Foley placed - clear yellow urine  A: active labor     FHR category 1  P: expectant management      Recheck in 2 hours   Marlinda Mikeanya Dillan Candela CNM, MSN, [facility] 07/15/2013, 9:36 AM

## 2013-07-15 NOTE — H&P (Signed)
  OB ADMISSION/ HISTORY & PHYSICAL:  Admission Date: 07/14/2013  6:51 PM  Admit Diagnosis: 40.1 weeks post dates induction   Emily BeckersKatelynn Santos is a 18 y.o. female presenting for induction of labor.  Prenatal History: G1P0   EDC : 07/14/2013, Set as working upon episode Administrator, [employer]  [facility]  Primary Ob Provider: Marlinda Mikeanya Saachi Zale CNM Prenatal course complicated by excessive maternal weight gain  Prenatal Labs: ABO, Rh: A/Positive/-- (08/19 0000) Antibody: Negative (08/19 0000) Rubella: Immune (08/19 0000)  RPR: NON REAC (04/08 1955)  HBsAg: Negative (08/19 0000)  HIV: Non-reactive (08/19 0000)  GTT: nl GBS: Positive (09/11 0000)   Medical / Surgical History :  Past medical history: No past medical history on file.   Past surgical history:  Past Surgical History  Procedure Laterality Date  . Wisdom tooth extraction     Family History:  Family History  Problem Relation Age of Onset  . Hyperlipidemia Father   . Hypothyroidism Father   . Migraines Father   . Hyperthyroidism Paternal Aunt   . Hypertension Paternal Grandmother     Social History:  reports that she has never smoked. She has never used smokeless tobacco. She reports that she does not drink alcohol or use illicit drugs.  Allergies: Sulfa antibiotics   Current Medications at time of admission:  Prior to Admission medications   Medication Sig Start Date End Date Taking? Authorizing Provider  acetaminophen (TYLENOL) 325 MG tablet Take 650 mg by mouth every 6 (six) hours as needed for headache.   Yes Historical Provider, MD  calcium carbonate (TUMS - DOSED IN MG ELEMENTAL CALCIUM) 500 MG chewable tablet Chew 2 tablets by mouth daily as needed for indigestion or heartburn.   Yes Historical Provider, MD  cetirizine (ZYRTEC) 10 MG tablet Take 10 mg by mouth daily.   Yes Historical Provider, MD  diphenhydrAMINE (BENADRYL) 2 % cream Apply 1 application topically 3 (three) times daily as  needed for itching.   Yes Historical Provider, MD  Doxylamine-Pyridoxine (DICLEGIS) 10-10 MG TBEC Take 1 tablet by mouth as needed (nausea).   Yes Historical Provider, MD  EVENING PRIMROSE OIL PO Take 1 capsule by mouth 2 (two) times daily.   Yes Historical Provider, MD  Multiple Vitamin (MULTIVITAMIN) tablet Take 1 tablet by mouth daily. Gummy prenatal vitamin   Yes Historical Provider, MD  ranitidine (ZANTAC) 150 MG capsule Take 150 mg by mouth 2 (two) times daily.   Yes Historical Provider, MD   Review of Systems: Active FM  Physical Exam:  VS: Blood pressure 102/59, pulse 76, temperature 98.1 F (36.7 C), temperature source Oral, resp. rate 18, height 5\' 8"  (1.727 m), weight 97.977 kg (216 lb).  General: alert and oriented, appears calm and comfortable Heart: RRR Lungs: Clear lung fields Abdomen: Gravid, soft and non-tender, non-distended / uterus: gravid Extremities: 1+ edema  Genitalia / VE: Dilation: 2 Effacement (%): 50 Station: -2 Exam by:: t. Bowden Boody cnm  FHR: baseline rate 135 / variability moderate / accelerations + / no decelerations TOCO: ctx mild irregular every 3-4 minutes  Assessment: [redacted] weeks gestation Induction of labor FHR category 1   Plan:  Admit cytotec 25mcg - cervical balloon - AROM with pitocin PCN for GBS   Marlinda Mikeanya Aleks Nawrot CNM, MSN, [facility] 07/15/2013, 1:25 AM

## 2013-07-15 NOTE — Progress Notes (Signed)
S:  Sleepy       Aware of ctx  O:  VS: Blood pressure 102/59, pulse 76, temperature 98.1 F (36.7 C), temperature source Oral, resp. rate 18, height 5\' 8"  (1.727 m), weight 97.977 kg (216 lb).        FHR : baseline 135 / variability moderate / accelerations + / no decelerations        Toco: contractions every 3 minutes / mild        Cervix : 2/50/-1/ vtx        Membranes: itnact  A: induction of  labor     FHR category 1  P: cervical balloon with traction every 2 hours      start PCN prophylaxis      Plan AROM pitocin after cervical balloon out  Marlinda Mikeanya Bailey CNM, MSN, [facility] 07/15/2013, 1:30 AM

## 2013-07-15 NOTE — Anesthesia Procedure Notes (Signed)
Epidural Patient location during procedure: OB Start time: 07/15/2013 8:57 AM End time: 07/15/2013 9:11 AM  Staffing Anesthesiologist: Jodell Weitman, CHRIS Performed by: anesthesiologist   Preanesthetic Checklist Completed: patient identified, surgical consent, pre-op evaluation, timeout performed, IV checked, risks and benefits discussed and monitors and equipment checked  Epidural Patient position: sitting Prep: site prepped and draped and DuraPrep Patient monitoring: heart rate, cardiac monitor, continuous pulse ox and blood pressure Approach: midline Location: L3-L4 Injection technique: LOR saline  Needle:  Needle type: Tuohy  Needle gauge: 17 G Needle length: 9 cm Needle insertion depth: 8 cm Catheter type: closed end flexible Catheter size: 19 Gauge Catheter at skin depth: 15 cm Test dose: Other  Assessment Events: blood not aspirated, injection not painful, no injection resistance, negative IV test and no paresthesia  Additional Notes H+P and labs checked, risks and benefits discussed with the patient, consent obtained, procedure tolerated well and without complications.  Reason for block:procedure for pain

## 2013-07-15 NOTE — Progress Notes (Signed)
S:  Painful ctx - wanting something for pain (epidural versus IV meds)  O:  VS: Blood pressure 99/60, pulse 59, temperature 98.3 F (36.8 C), temperature source Oral, resp. rate 18, height 5\' 8"  (1.727 m), weight 97.977 kg (216 lb).        FHR : baseline 125 / variability moderate / accelerations + / no decelerations        Toco: contractions every 2 minutes / pitocin 10 mu/min         Cervix : 6/90% / vtx -1 / BBOW        Membranes: intact  A: active labor     FHR category 1  P: place epidural      AROM after epidural    Marlinda Mikeanya Hermen Mario CNM, MSN, [facility] 07/15/2013, 8:38 AM

## 2013-07-15 NOTE — Progress Notes (Signed)
S:  slept well after stadol and phenergan       aware of ctx now but not painful  O:  VS: Blood pressure 107/55, pulse 75, temperature 98 F (36.7 C), temperature source Oral, resp. rate 20, height 5\' 8"  (1.727 m), weight 97.977 kg (216 lb).        FHR : baseline 125 / variability moderate / accelerations + / no decelerations        Toco: contractions every 3-6 minutes / mild         Cervix : 5 / 80% / vtx -1 BBOW        Membranes: stripped  A: induction of  Labor     Favorable cervix after cytotec 25mcg and cervical balloon     FHR category 1  P: pitocin low dose protocol this AM      epidural for pain management      anticipate SVB  Emily Santos CNM, MSN, [facility] 07/15/2013, 5:30 AM

## 2013-07-16 ENCOUNTER — Encounter (HOSPITAL_COMMUNITY): Payer: Self-pay

## 2013-07-16 LAB — CBC
HCT: 33 % — ABNORMAL LOW (ref 36.0–46.0)
Hemoglobin: 10.6 g/dL — ABNORMAL LOW (ref 12.0–15.0)
MCH: 29.6 pg (ref 26.0–34.0)
MCHC: 32.1 g/dL (ref 30.0–36.0)
MCV: 92.2 fL (ref 78.0–100.0)
Platelets: 221 10*3/uL (ref 150–400)
RBC: 3.58 MIL/uL — ABNORMAL LOW (ref 3.87–5.11)
RDW: 14.3 % (ref 11.5–15.5)
WBC: 16.8 10*3/uL — ABNORMAL HIGH (ref 4.0–10.5)

## 2013-07-16 NOTE — Progress Notes (Addendum)
PPD #1- SVD  Subjective:   Reports feeling well, perineum sore Tolerating po/ No nausea or vomiting Bleeding is light Pain controlled with Motrin Up ad lib / ambulatory / voiding without problems Newborn: breastfeeding  / Circumcision: outpatient   Objective:   VS:  VS:  Filed Vitals:   07/15/13 1726 07/15/13 2047 07/16/13 0620 07/16/13 0629  BP: 101/63 114/62 103/61   Pulse: 93 101 86   Temp: 98.3 F (36.8 C) 98.1 F (36.7 C) 98.4 F (36.9 C)   TempSrc: Oral Oral Oral   Resp: 18 16 18    Height:      Weight:  93.441 kg (206 lb)  92.08 kg (203 lb)  SpO2:        LABS:  Recent Labs  07/14/13 1955 07/16/13 0628  WBC 10.6* 16.8*  HGB 12.3 10.6*  PLT 269 221   Blood type: A/Positive/-- (08/19 0000) Rubella: Immune (08/19 0000)   I&O: Intake/Output     04/09 0701 - 04/10 0700 04/10 0701 - 04/11 0700   P.O. 960    Total Intake(mL/kg) 960 (10.4)    Urine (mL/kg/hr) 402 (0.2)    Blood 200 (0.1)    Total Output 602     Net +358            Physical Exam: Alert and oriented x3 Abdomen: soft, non-tender, non-distended  Fundus: firm, non-tender, U-1 Perineum: Well approximated, no significant erythema, edema, or drainage; healing well. Lochia: small Extremities: 2+ BLE edema, no calf pain or tenderness    Assessment:  PPD # [id number]/ S/P:induced vaginal delivery, vaginal and sulcus laceration Mild anemia  Dependent edema Doing well    Plan: Continue routine post partum orders Anticipate D/C home tomorrow   Lawernce PittsMelanie N Chablis Losh MSN, CNM 07/16/2013, 12:37 PM

## 2013-07-16 NOTE — Anesthesia Postprocedure Evaluation (Signed)
  Anesthesia Post-op Note  Patient: Zollie BeckersKatelynn Niesen  Procedure(s) Performed: * No procedures listed *  Patient Location: Mother/Baby  Anesthesia Type:Epidural  Level of Consciousness: awake  Airway and Oxygen Therapy: Patient Spontanous Breathing  Post-op Pain: none  Post-op Assessment: Patient's Cardiovascular Status Stable, Respiratory Function Stable, Patent Airway, No signs of Nausea or vomiting, Adequate PO intake, Pain level controlled, No headache, No backache, No residual numbness and No residual motor weakness  Post-op Vital Signs: Reviewed and stable  Last Vitals:  Filed Vitals:   07/16/13 0620  BP: 103/61  Pulse: 86  Temp: 36.9 C  Resp: 18    Complications: No apparent anesthesia complications

## 2013-07-16 NOTE — Lactation Note (Signed)
This note was copied from the chart of Emily Zollie BeckersKatelynn Seman. Lactation Consultation Note  Patient Name: Emily Santos ZOXWR'UToday's Date: 07/16/2013 Reason for consult: Initial assessment  Visited with Mom, baby at 3223 hrs old.  Basic teaching done, including importance of having baby skin to skin, and watching for cues to feed.  Baby undressed, and assisted Mom to use cross cradle rather than cradle hold.  Baby latched easily, no discomfort.  No swallows heard, but baby sucking rhythmically with pause and bursts of suck.  Room full of visitors including a toddler.  Baby has great output.   Brochure left in room.  Informed Mom of IP and OP lactation support available.  To call prn.  Follow up tomorrow.    Consult Status Consult Status: Follow-up Date: 07/17/13 Follow-up type: In-patient    Judee ClaraCaroline E Anastacio Bua 07/16/2013, 11:54 AM

## 2013-07-17 MED ORDER — HYDROCORTISONE ACE-PRAMOXINE 1-1 % RE FOAM: 1.0000 | Freq: Three times a day (TID) | RECTAL | Status: DC

## 2013-07-17 MED ORDER — HYDROCHLOROTHIAZIDE 25 MG PO TABS: 25.0000 mg | ORAL_TABLET | Freq: Every day | ORAL | Status: DC

## 2013-07-17 MED ORDER — IBUPROFEN 600 MG PO TABS: 600.0000 mg | ORAL_TABLET | Freq: Four times a day (QID) | ORAL | Status: DC

## 2013-07-17 NOTE — Lactation Note (Signed)
This note was copied from the chart of Boy Zollie BeckersKatelynn Weems. Lactation Consultation Note Mom states breast feeding is going very well, mom has no concerns at this time. Mom states baby has good rhythm, audible swallows, and no nipple pain. Enc mom to call the lactation office if she has any concerns, and to attend the BFSG.  Patient Name: Boy Zollie BeckersKatelynn Feehan ZOXWR'UToday's Date: 07/17/2013     Maternal Data    Feeding    LATCH Score/Interventions                      Lactation Tools Discussed/Used     Consult Status      Talmadge Coventrylizabeth F Rocky Gladden 07/17/2013, 12:31 PM

## 2013-07-17 NOTE — Discharge Summary (Signed)
Obstetric Discharge Summary  Reason for Admission: induction of labor - postdates / dedpendent edema / excessive maternal weight gain Prenatal Procedures: none Intrapartum Procedures: spontaneous vaginal delivery and GBS prophylaxis Postpartum Procedures: none Complications-Operative and Postpartum: sulcus vaginal laceration repaired Hemoglobin  Date Value Ref Range Status  07/16/2013 10.6* 12.0 - 15.0 g/dL Final     HCT  Date Value Ref Range Status  07/16/2013 33.0* 36.0 - 46.0 % Final    Physical Exam:  General: alert, cooperative and no distress Lochia: appropriate Uterine Fundus: firm Incision: healing well DVT Evaluation: No evidence of DVT seen on physical exam.  Discharge Diagnoses: Term Pregnancy-delivered  Discharge Information: Date: 07/17/2013 Activity: pelvic rest Diet: routine Medications: PNV and Ibuprofen Condition: stable Instructions: refer to practice specific booklet Discharge to: home Follow-up Information   Follow up with Emily Santos, Emily Santos, CNM. Schedule an appointment as soon as possible for a visit in 6 weeks.   Specialty:  Obstetrics and Gynecology   Contact information:   [address] [place] [id number] [phone]       Newborn Data: Live born female  Birth Weight: 8 lb 6.9 oz (3825 g) APGAR: 8, 9  Home with mother.  Emily Santos 07/17/2013, 9:29 AM

## 2013-07-17 NOTE — Progress Notes (Signed)
PPD 2 SVD  S:  Reports feeling well             Tolerating po/ No nausea or vomiting             Bleeding is light             Pain controlled with motrin             Up ad lib / ambulatory / voiding QS  Newborn breast feeding  / Circumcision done  O:               VS: BP 102/46  Pulse 86  Temp(Src) 98.6 F (37 C) (Oral)  Resp 17  Ht 5\' 8"  (1.727 m)  Wt 91.173 kg (201 lb)  BMI 30.57 kg/m2  SpO2 100%  Breastfeeding? Unknown   LABS:              Recent Labs  07/14/13 1955 07/16/13 0628  WBC 10.6* 16.8*  HGB 12.3 10.6*  PLT 269 221               Blood type: A/Positive/-- (08/19 0000)  Rubella: Immune (08/19 0000)                     I&O: Intake/Output     04/10 0701 - 04/11 0700 04/11 0701 - 04/12 0700   P.O.     Total Intake(mL/kg)     Urine (mL/kg/hr) 450 (0.2)    Blood     Total Output 450     Net -450                        Physical Exam:             Alert and oriented X3  Lungs: Clear and unlabored  Heart: regular rate and rhythm / no mumurs  Abdomen: soft, non-tender, non-distended              Fundus: firm, non-tender, U-1  Perineum: no edema/ intact  Lochia: light  Extremities: trace edema, no calf pain or tenderness    A: PPD # 2   Doing well - stable status  P: Routine post partum orders  DC home  Marlinda Mikeanya Linley Moxley CNM, MSN, [facility] 07/17/2013, 9:27 AM

## 2014-02-07 ENCOUNTER — Encounter (HOSPITAL_COMMUNITY): Payer: Self-pay

## 2015-05-02 ENCOUNTER — Ambulatory Visit (INDEPENDENT_AMBULATORY_CARE_PROVIDER_SITE_OTHER): Payer: BC Managed Care – PPO | Admitting: Family Medicine

## 2015-05-02 ENCOUNTER — Ambulatory Visit (INDEPENDENT_AMBULATORY_CARE_PROVIDER_SITE_OTHER): Payer: BC Managed Care – PPO

## 2015-05-02 VITALS — BP 124/80 | HR 110 | Temp 98.9°F | Resp 17 | Ht 67.5 in | Wt 183.0 lb

## 2015-05-02 DIAGNOSIS — J029 Acute pharyngitis, unspecified: Secondary | ICD-10-CM

## 2015-05-02 DIAGNOSIS — R1030 Lower abdominal pain, unspecified: Secondary | ICD-10-CM

## 2015-05-02 DIAGNOSIS — J020 Streptococcal pharyngitis: Secondary | ICD-10-CM

## 2015-05-02 LAB — POCT URINALYSIS DIP (MANUAL ENTRY)
Blood, UA: NEGATIVE
Glucose, UA: NEGATIVE
Leukocytes, UA: NEGATIVE
Nitrite, UA: NEGATIVE
Protein Ur, POC: 30 — AB
Spec Grav, UA: >=1.03
Urobilinogen, UA: 4
pH, UA: 5.5

## 2015-05-02 LAB — POC MICROSCOPIC URINALYSIS (UMFC)

## 2015-05-02 LAB — POCT RAPID STREP A (OFFICE): Rapid Strep A Screen: POSITIVE — AB

## 2015-05-02 MED ORDER — AMOXICILLIN 875 MG PO TABS: 875.0000 mg | ORAL_TABLET | Freq: Two times a day (BID) | ORAL | Status: DC

## 2015-05-02 NOTE — Patient Instructions (Addendum)
Because you received an x-ray today, you will receive an invoice from [facility]. Please contact [facility] at [phone] with questions or concerns regarding your invoice. Our billing staff will not be able to assist you with those questions.    Drink plenty of fluids and get enough rest  Take amoxicillin 875 mg one twice daily for infection  Take MiraLAX 1 dose every day until stools are a little on the loose side.  If the abdomen gets worse return or go to the emergency room  Take Tylenol or ibuprofen if needed for fever and achiness

## 2015-05-02 NOTE — Progress Notes (Signed)
Patient ID: Emily Santos, female    DOB: 1995-07-06  Age: 20 y.o. MRN: [id number]  Chief Complaint  Patient presents with  . very lower abdominal pain  . neck swelling    Subjective:   Patient is here for a couple of things. For 3 or 4 days she's been having pain in both lower quadrants of her lower abdomen. It's a discomfort, not severe pain. No nausea or vomiting. Her bowels have moved and she does not consider herself because it. She has an IUD. Her last menstrual cycle was 2 weeks ago and she is not pregnant. She also has had a sore throat for the last 3 days. She has been febrile. Has swollen glands in her neck.    Current allergies, medications, problem list, past/family and social histories reviewed.  Objective:  BP 124/80 mmHg  Pulse 110  Temp(Src) 98.9 F (37.2 C) (Oral)  Resp 17  Ht 5' 7.5" (1.715 m)  Wt 183 lb (83.008 kg)  BMI 28.22 kg/m2  SpO2 97%  Throat erythematous with exudate. Moderate nodes. Chest clear. Heart regular without murmurs. Abdomen has normal bowel sounds. Soft without organomegaly or masses. Mild bilateral lower quadrant tenderness with no rebound. Bimanual exam reveals no adnexal or uterine masses or tenderness. I could feel the IUD cord.  Results for orders placed or performed in visit on 05/02/15  POCT urinalysis dipstick  Result Value Ref Range   Color, UA orange (A) yellow   Clarity, UA clear clear   Glucose, UA negative negative   Bilirubin, UA moderate (A) negative   Ketones, POC UA moderate (40) (A) negative   Spec Grav, UA >=1.030    Blood, UA negative negative   pH, UA 5.5    Protein Ur, POC =30 (A) negative   Urobilinogen, UA 4.0    Nitrite, UA Negative Negative   Leukocytes, UA Negative Negative  POCT Microscopic Urinalysis ([facility])  Result Value Ref Range   WBC,UR,HPF,POC Few (A) None WBC/hpf   RBC,UR,HPF,POC None None RBC/hpf   Bacteria Few (A) None, Too numerous to count   Mucus Present (A) Absent   Epithelial Cells, UR  Per Microscopy Moderate (A) None, Too numerous to count cells/hpf  POCT rapid strep A  Result Value Ref Range   Rapid Strep A Screen Positive (A) Negative   X-ray shows a nonspecific abdomen. Radiologist has not yet read.  Assessment & Plan:   Assessment: 1. Acute pharyngitis, unspecified etiology   2. Abdominal pain, lower       Plan: Treat for the strep. Have her use a little laxative to clean herself out a little bit more. If she gets worse she is to return  Orders Placed This Encounter  Procedures  . DG Abd 2 Views    Order Specific Question:  Reason for Exam (SYMPTOM  OR DIAGNOSIS REQUIRED)    Answer:  bilateral low abdominal pain    Order Specific Question:  Is the patient pregnant?    Answer:  No    Order Specific Question:  Preferred imaging location?    Answer:  External  . POCT urinalysis dipstick  . POCT Microscopic Urinalysis ([facility])  . POCT rapid strep A    No orders of the defined types were placed in this encounter.         Patient Instructions   Because you received an x-ray today, you will receive an invoice from [facility]. Please contact [facility] at [phone] with questions or concerns regarding your  invoice. Our billing staff will not be able to assist you with those questions.    Drink plenty of fluids and get enough rest  Take amoxicillin 875 mg one twice daily for infection  Take MiraLAX 1 dose every day until stools are a little on the loose side.  If the abdomen gets worse return or go to the emergency room       Return if symptoms worsen or fail to improve.   HOPPER,DAVID, MD 05/02/2015

## 2015-06-29 ENCOUNTER — Ambulatory Visit (INDEPENDENT_AMBULATORY_CARE_PROVIDER_SITE_OTHER): Payer: BC Managed Care – PPO | Admitting: Family Medicine

## 2015-06-29 ENCOUNTER — Ambulatory Visit (INDEPENDENT_AMBULATORY_CARE_PROVIDER_SITE_OTHER): Payer: BC Managed Care – PPO

## 2015-06-29 VITALS — BP 112/68 | HR 72 | Temp 98.5°F | Resp 16 | Ht 67.0 in | Wt 175.0 lb

## 2015-06-29 DIAGNOSIS — R51 Headache: Secondary | ICD-10-CM

## 2015-06-29 DIAGNOSIS — M545 Low back pain, unspecified: Secondary | ICD-10-CM

## 2015-06-29 DIAGNOSIS — R0789 Other chest pain: Secondary | ICD-10-CM

## 2015-06-29 DIAGNOSIS — M25522 Pain in left elbow: Secondary | ICD-10-CM | POA: Diagnosis not present

## 2015-06-29 DIAGNOSIS — R519 Headache, unspecified: Secondary | ICD-10-CM

## 2015-06-29 DIAGNOSIS — M79604 Pain in right leg: Secondary | ICD-10-CM

## 2015-06-29 MED ORDER — HYDROCODONE-ACETAMINOPHEN 5-325 MG PO TABS: 1.0000 | ORAL_TABLET | Freq: Four times a day (QID) | ORAL | Status: DC | PRN

## 2015-06-29 MED ORDER — DICLOFENAC SODIUM 75 MG PO TBEC: 75.0000 mg | DELAYED_RELEASE_TABLET | Freq: Two times a day (BID) | ORAL | Status: DC
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
## 2015-06-29 NOTE — Patient Instructions (Addendum)
Your x-rays show no fractures. I believe these sore areas represent contusions with some bleeding under the skin and injury to the muscles. That is why you cannot straighten your leg.  Usually we recommend ice for the first 24-48 hours over the sore areas and then heat after that.  I am ordering some pain medicine and anti-inflammatory medicine to help control the discomfort while you're healing    IF you received an x-ray today, you will receive an invoice from [facility]. Please contact [facility] at [phone] with questions or concerns regarding your invoice.   IF you received labwork today, you will receive an invoice from [facility]. Please contact [facility] at [phone] with questions or concerns regarding your invoice.   Our billing staff will not be able to assist you with questions regarding bills from these companies.  You will be contacted with the lab results as soon as they are available. The fastest way to get your results is to activate your My Chart account. Instructions are located on the last page of this paperwork. If you have not heard from us regarding the results in 2 weeks, please contact this office.

## 2015-08-26 ENCOUNTER — Ambulatory Visit (INDEPENDENT_AMBULATORY_CARE_PROVIDER_SITE_OTHER): Payer: BC Managed Care – PPO | Admitting: Family Medicine

## 2015-08-26 VITALS — BP 107/64 | HR 78 | Temp 98.0°F | Resp 16 | Ht 67.0 in | Wt 183.2 lb

## 2015-08-26 DIAGNOSIS — J209 Acute bronchitis, unspecified: Secondary | ICD-10-CM | POA: Diagnosis not present

## 2015-08-26 MED ORDER — PREDNISONE 10 MG PO TABS: 30.0000 mg | ORAL_TABLET | Freq: Every day | ORAL | Status: DC

## 2015-08-26 MED ORDER — ALBUTEROL SULFATE 108 (90 BASE) MCG/ACT IN AEPB: 1.0000 | INHALATION_SPRAY | RESPIRATORY_TRACT | Status: DC | PRN

## 2015-08-26 MED ORDER — ALBUTEROL SULFATE (2.5 MG/3ML) 0.083% IN NEBU
2.5000 mg | INHALATION_SOLUTION | Freq: Once | RESPIRATORY_TRACT | Status: AC
  Administered 2015-08-26: 2.5 mg via RESPIRATORY_TRACT

## 2015-08-26 NOTE — Patient Instructions (Addendum)
Thank you for coming in today. Call or go to the emergency room if you get worse, have trouble breathing, have chest pains, or palpitations.  Use the albuterol every 4-6 hours as needed.  Take prednisone daily.  Return as needed.   Acute Bronchitis Bronchitis is inflammation of the airways that extend from the windpipe into the lungs (bronchi). The inflammation often causes mucus to develop. This leads to a cough, which is the most common symptom of bronchitis.  In acute bronchitis, the condition usually develops suddenly and goes away over time, usually in a couple weeks. Smoking, allergies, and asthma can make bronchitis worse. Repeated episodes of bronchitis may cause further lung problems.  CAUSES Acute bronchitis is most often caused by the same virus that causes a cold. The virus can spread from person to person (contagious) through coughing, sneezing, and touching contaminated objects. SIGNS AND SYMPTOMS   Cough.   Fever.   Coughing up mucus.   Body aches.   Chest congestion.   Chills.   Shortness of breath.   Sore throat.  DIAGNOSIS  Acute bronchitis is usually diagnosed through a physical exam. Your health care provider will also ask you questions about your medical history. Tests, such as chest X-rays, are sometimes done to rule out other conditions.  TREATMENT  Acute bronchitis usually goes away in a couple weeks. Oftentimes, no medical treatment is necessary. Medicines are sometimes given for relief of fever or cough. Antibiotic medicines are usually not needed but may be prescribed in certain situations. In some cases, an inhaler may be recommended to help reduce shortness of breath and control the cough. A cool mist vaporizer may also be used to help thin bronchial secretions and make it easier to clear the chest.  HOME CARE INSTRUCTIONS  Get plenty of rest.   Drink enough fluids to keep your urine clear or pale yellow (unless you have a medical condition  that requires fluid restriction). Increasing fluids may help thin your respiratory secretions (sputum) and reduce chest congestion, and it will prevent dehydration.   Take medicines only as directed by your health care provider.  If you were prescribed an antibiotic medicine, finish it all even if you start to feel better.  Avoid smoking and secondhand smoke. Exposure to cigarette smoke or irritating chemicals will make bronchitis worse. If you are a smoker, consider using nicotine gum or skin patches to help control withdrawal symptoms. Quitting smoking will help your lungs heal faster.   Reduce the chances of another bout of acute bronchitis by washing your hands frequently, avoiding people with cold symptoms, and trying not to touch your hands to your mouth, nose, or eyes.   Keep all follow-up visits as directed by your health care provider.  SEEK MEDICAL CARE IF: Your symptoms do not improve after 1 week of treatment.  SEEK IMMEDIATE MEDICAL CARE IF:  You develop an increased fever or chills.   You have chest pain.   You have severe shortness of breath.  You have bloody sputum.   You develop dehydration.  You faint or repeatedly feel like you are going to pass out.  You develop repeated vomiting.  You develop a severe headache. MAKE SURE YOU:   Understand these instructions.  Will watch your condition.  Will get help right away if you are not doing well or get worse.   This information is not intended to replace advice given to you by your health care provider. Make sure you discuss any questions  you have with your health care provider.   Document Released: 05/02/2004 Document Revised: 04/15/2014 Document Reviewed: 09/15/2012 Elsevier Interactive Patient Education 2016 [employer].    IF you received an x-ray today, you will receive an invoice from [name]. Please contact [facility] at [phone] with questions or concerns regarding  your invoice.   IF you received labwork today, you will receive an invoice from [facility]. Please contact [facility] at [phone] with questions or concerns regarding your invoice.   Our billing staff will not be able to assist you with questions regarding bills from these companies.  You will be contacted with the lab results as soon as they are available. The fastest way to get your results is to activate your My Chart account. Instructions are located on the last page of this paperwork. If you have not heard from [place] regarding the results in 2 weeks, please contact this office.

## 2015-08-26 NOTE — Progress Notes (Signed)
Emily Santos is a 20 y.o. female who presents to Urgent Care today for cough congestion shortness of breath. Symptoms present for a few days now. Patient has a history of asthma. She notes chest tightness as well. She denies any chest pain fevers or chills. Cough is somewhat bothersome. Patient has tried some over-the-counter medicines which have helped a bit. She has a personal history of asthma.   History reviewed. No pertinent past medical history. Past Surgical History  Procedure Laterality Date  . Wisdom tooth extraction     Social History  Substance Use Topics  . Smoking status: Never Smoker   . Smokeless tobacco: Never Used  . Alcohol Use: Not on file   ROS as above Medications: Current Outpatient Prescriptions  Medication Sig Dispense Refill  . acetaminophen (TYLENOL) 325 MG tablet Take 650 mg by mouth every 6 (six) hours as needed for headache.    . cetirizine (ZYRTEC) 10 MG tablet Take 10 mg by mouth daily.    Marland Kitchen. ibuprofen (ADVIL,MOTRIN) 600 MG tablet Take 1 tablet (600 mg total) by mouth every 6 (six) hours. 30 tablet 0  . Albuterol Sulfate (PROAIR RESPICLICK) 108 (90 Base) MCG/ACT AEPB Inhale 1-2 puffs into the lungs every 4 (four) hours as needed. 1 each 1  . predniSONE (DELTASONE) 10 MG tablet Take 3 tablets (30 mg total) by mouth daily with breakfast. 15 tablet 0   No current facility-administered medications for this visit.   Allergies  Allergen Reactions  . Sulfa Antibiotics Rash     Exam:  BP 107/64 mmHg  Pulse 78  Temp(Src) 98 F (36.7 C) (Oral)  Resp 16  Ht 5\' 7"  (1.702 m)  Wt 183 lb 3.2 oz (83.099 kg)  BMI 28.69 kg/m2  SpO2 97% Gen: Well NAD HEENT: EOMI,  MMM Clear nasal discharge. Lungs: Normal work of breathing. CTABL slight prolonged expiratory phase without wheezing. Heart: RRR no MRG Abd: NABS, Soft. Nondistended, Nontender Exts: Brisk capillary refill, warm and well perfused.   Patient was given a 2.5 mg albuterol nebulizer treatment and  felt better  No results found for this or any previous visit (from the past 24 hour(s)). No results found.  Assessment and Plan: 20 y.o. female with bronchitis versus asthma. Treat with albuterol and prednisone. Follow-up with PCP as needed.  Discussed warning signs or symptoms. Please see discharge instructions. Patient expresses understanding.

## 2016-01-29 ENCOUNTER — Telehealth: Payer: Self-pay

## 2016-01-29 ENCOUNTER — Ambulatory Visit: Payer: BC Managed Care – PPO | Admitting: Neurology

## 2016-01-29 NOTE — Telephone Encounter (Deleted)
fff

## 2016-01-29 NOTE — Telephone Encounter (Addendum)
Patient was late for appt today. Pt was told 1130am per her email from eye doctor. Pt did reschedule.

## 2016-01-29 NOTE — Telephone Encounter (Deleted)
ffff

## 2016-01-31 ENCOUNTER — Ambulatory Visit (INDEPENDENT_AMBULATORY_CARE_PROVIDER_SITE_OTHER): Payer: BC Managed Care – PPO | Admitting: Neurology

## 2016-01-31 ENCOUNTER — Encounter: Payer: Self-pay | Admitting: Neurology

## 2016-01-31 VITALS — BP 103/60 | HR 67 | Ht 67.0 in | Wt 170.0 lb

## 2016-01-31 DIAGNOSIS — H47332 Pseudopapilledema of optic disc, left eye: Secondary | ICD-10-CM | POA: Diagnosis not present

## 2016-01-31 DIAGNOSIS — G441 Vascular headache, not elsewhere classified: Secondary | ICD-10-CM

## 2016-01-31 DIAGNOSIS — H5462 Unqualified visual loss, left eye, normal vision right eye: Secondary | ICD-10-CM | POA: Diagnosis not present

## 2016-01-31 DIAGNOSIS — G4440 Drug-induced headache, not elsewhere classified, not intractable: Secondary | ICD-10-CM | POA: Insufficient documentation

## 2016-01-31 DIAGNOSIS — H5712 Ocular pain, left eye: Secondary | ICD-10-CM

## 2016-01-31 DIAGNOSIS — H47339 Pseudopapilledema of optic disc, unspecified eye: Secondary | ICD-10-CM | POA: Insufficient documentation

## 2016-01-31 MED ORDER — TOPIRAMATE 25 MG PO TABS: 25.0000 mg | ORAL_TABLET | Freq: Two times a day (BID) | ORAL | 3 refills | Status: DC

## 2016-01-31 NOTE — Progress Notes (Signed)
[facility] [address] [place]. [place] [address] [phone]       OFFICE CONSULT NOTE  Emily. Emily Santos Date of Birth:  Jun 19, 1995 Medical Record Number:  [id number]   Referring MD:  Phineas Douglas ,OD Reason for Referral:  Headache and left eye pain HPI: Emily Santos is a 73 year Caucasian lady who is accompanied by her mother today for this visit. History is obtained from the patient and review of referral notes. Patient has been complaining of intermittent sharp shooting pain behind the left eye for the last 4-6 weeks. He states there are no specific triggers. The headache can occur several times a day. It can last for hours. It can be disabling. She takes Excedrin which usually gets rid of the pain for over 3040 minutes of so but then the pain returns but is tolerable for several hours. She denies any complaint nausea, vomiting, light or sound sensitivity. There are no specific triggers noted for his pain. This is always been on the left side. The patient states that she's had glasses for vision since childhood and always had some high refractory number in the left eye however for the last 2 months despite prescription of stronger lenses for the left eye she has had trouble with vision acuity and focusing and reading from the left eye. She had a dilated eye exam by optometrist Dr. 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 Patient denies taking excessive vitamins and nutritional supplements. She has an IUD for the last 2 years but does not take birth control pills. She does have history of migraines but they're not quite frequent and usually triggered by weather changes. She feels the current headaches are quite different from her prior migraines. She has not had any recent brain imaging study done. She works at [employer] in [employer] and is also a Research officer, trade union at [employer]. She denies any double vision, vertigo, gait  balance problems. She has never seen a neurologist in the past.  ROS:   14 system review of systems is positive for  weight gain, blurred vision, eye pain, easy bruising, allergies, runny nose, headache, anxiety, change in appetite and all the systems negative  PMH:  Past Medical History:  Diagnosis Date  . Headache     Social History:  Social History   Social History  . Marital status: Single    Spouse name: N/A  . Number of children: N/A  . Years of education: N/A   Occupational History  . Not on file.   Social History Main Topics  . Smoking status: Never Smoker  . Smokeless tobacco: Never Used  . Alcohol use Not on file  . Drug use: No  . Sexual activity: Yes   Other Topics Concern  . Not on file   Social History Narrative  . No narrative on file    Medications:   Current Outpatient Prescriptions on File Prior to Visit  Medication Sig Dispense Refill  . acetaminophen (TYLENOL) 325 MG tablet Take 650 mg by mouth every 6 (six) hours as needed for headache.    . Albuterol Sulfate (PROAIR RESPICLICK) 601 (90 Base) MCG/ACT AEPB Inhale 1-2 puffs into the lungs every 4 (four) hours as needed. 1 each 1  . cetirizine (ZYRTEC) 10 MG tablet Take 10 mg by mouth as needed.     Marland Kitchen ibuprofen (ADVIL,MOTRIN) 600 MG tablet Take 1 tablet (600 mg total) by mouth every  6 (six) hours. (Patient taking differently: Take 600 mg by mouth every 6 (six) hours as needed. ) 30 tablet 0   No current facility-administered medications on file prior to visit.     Allergies:   Allergies  Allergen Reactions  . Sulfa Antibiotics Rash    Physical Exam General: well developed, well nourished young Caucasian lady, seated, in no evident distress Head: head normocephalic and atraumatic.   Neck: supple with no carotid or supraclavicular bruits Cardiovascular: regular rate and rhythm, no murmurs Musculoskeletal: no deformity Skin:  no rash/petichiae Vascular:  Normal pulses all  extremities  Neurologic Exam Mental Status: Awake and fully alert. Oriented to place and time. Recent and remote memory intact. Attention span, concentration and fund of knowledge appropriate. Mood and affect appropriate.  Cranial Nerves: Fundoscopic exam reveals sharp disc margins except for nasal margins of the left disc which is slightly blurred. There is no peripapillary hemorrhages or exudates noted.. Vision acuity is 20/20 on the right and 20/50 on the left. Pupils equal, briskly reactive to light. No afferent pupillary defect noted. Extraocular movements full without nystagmus. Visual fields full to confrontation. Hearing intact. Facial sensation intact. Face, tongue, palate moves normally and symmetrically.  Motor: Normal bulk and tone. Normal strength in all tested extremity muscles. Sensory.: intact to touch , pinprick , position and vibratory sensation.  Coordination: Rapid alternating movements normal in all extremities. Finger-to-nose and heel-to-shin performed accurately bilaterally. Gait and Station: Arises from chair without difficulty. Stance is normal. Gait demonstrates normal stride length and balance . Able to heel, toe and tandem walk without difficulty.  Reflexes: 1+ and symmetric. Toes downgoing.       ASSESSMENT: 42 year Caucasian lady with subacute history of retro-orbital   left eye pain with  Concern for some diminished vision acuity. Neurological exam is fairly unremarkable except question of pseudo papilledema .Concern for underlying structural lesion involving the left optic nerve.    PLAN: I had a long discussion with the patient and her mother regarding her retro-orbital bite to left eye pain and headaches discussed my differential diagnosis, plan for evaluation, treatment and answered questions. I recommend a trial of Topamax 25 mg daily for 1 week increase if tolerated without side effects to twice daily. Continue to use Excedrin for symptomatic relief. Check MRI  scan of the brain and orbits with and without contrast to evaluate for any underlying structural lesions or masses. Check ESR, ANA, comprehensive metabolic panel and CBC. Greater than 50% time during this 45 minute consultation visit was spent on counseling and coordination of care about her headaches, diminished vision acuity and plan for treatment and evaluation. Patient to return for follow-up in 2 months or call earlier if she has worsening headaches or loss of vision. Antony Contras, MD  [facility] [address] [place], [place] [address]  Phone [phone] Fax [phone] Note: This document was prepared with digital dictation and possible smart phrase technology. Any transcriptional errors that result from this process are unintentional.

## 2016-01-31 NOTE — Patient Instructions (Signed)
I had a long discussion with the patient and her mother regarding her retro-orbital bite to left eye pain and headaches discussed my differential diagnosis, plan for evaluation, treatment and answered questions. I recommend a trial of Topamax 25 mg daily for 1 week increase if tolerated without side effects to twice daily. Continue to use Excedrin for symptomatic relief. Check MRI scan of the brain and orbits with and without contrast to evaluate for any underlying structural lesions or masses. Check ESR, ANA, comprehensive metabolic panel and CBC. Patient to return for follow-up in 2 months or call earlier if she has worsening headaches or loss of vision.

## 2016-02-01 ENCOUNTER — Telehealth: Payer: Self-pay

## 2016-02-01 LAB — CBC WITH DIFFERENTIAL/PLATELET
Basophils Absolute: 0.1 10*3/uL (ref 0.0–0.2)
Basos: 1 %
EOS (ABSOLUTE): 0.1 10*3/uL (ref 0.0–0.4)
Eos: 1 %
Hematocrit: 43.8 % (ref 34.0–46.6)
Hemoglobin: 14.2 g/dL (ref 11.1–15.9)
Immature Grans (Abs): 0 10*3/uL (ref 0.0–0.1)
Immature Granulocytes: 0 %
Lymphocytes Absolute: 1.4 10*3/uL (ref 0.7–3.1)
Lymphs: 20 %
MCH: 30.1 pg (ref 26.6–33.0)
MCHC: 32.4 g/dL (ref 31.5–35.7)
MCV: 93 fL (ref 79–97)
Monocytes Absolute: 0.5 10*3/uL (ref 0.1–0.9)
Monocytes: 7 %
Neutrophils Absolute: 4.9 10*3/uL (ref 1.4–7.0)
Neutrophils: 71 %
Platelets: 250 10*3/uL (ref 150–379)
RBC: 4.71 x10E6/uL (ref 3.77–5.28)
RDW: 12.9 % (ref 12.3–15.4)
WBC: 6.9 10*3/uL (ref 3.4–10.8)

## 2016-02-01 LAB — COMPREHENSIVE METABOLIC PANEL
ALT: 12 IU/L (ref 0–32)
AST: 14 IU/L (ref 0–40)
Albumin/Globulin Ratio: 1.9 (ref 1.2–2.2)
Albumin: 4.7 g/dL (ref 3.5–5.5)
Alkaline Phosphatase: 54 IU/L (ref 39–117)
BUN/Creatinine Ratio: 18 (ref 9–23)
BUN: 15 mg/dL (ref 6–20)
Bilirubin Total: 0.7 mg/dL (ref 0.0–1.2)
CO2: 23 mmol/L (ref 18–29)
Calcium: 9.8 mg/dL (ref 8.7–10.2)
Chloride: 102 mmol/L (ref 96–106)
Creatinine, Ser: 0.84 mg/dL (ref 0.57–1.00)
GFR calc Af Amer: 115 mL/min/{1.73_m2} (ref >59)
GFR calc non Af Amer: 100 mL/min/{1.73_m2} (ref >59)
Globulin, Total: 2.5 g/dL (ref 1.5–4.5)
Glucose: 76 mg/dL (ref 65–99)
Potassium: 4.7 mmol/L (ref 3.5–5.2)
Sodium: 140 mmol/L (ref 134–144)
Total Protein: 7.2 g/dL (ref 6.0–8.5)

## 2016-02-01 LAB — SEDIMENTATION RATE: Sed Rate: 7 mm/hr (ref 0–32)

## 2016-02-01 LAB — ANA: Anti Nuclear Antibody(ANA): NEGATIVE

## 2016-02-01 NOTE — Telephone Encounter (Signed)
Receive incoming call from patient about lab work. Rn stated all her lab work was normal. Pt verbalized understanding.

## 2016-02-01 NOTE — Telephone Encounter (Signed)
LFt vm for patient to call back about lab work results. 

## 2016-02-01 NOTE — Telephone Encounter (Signed)
-----   Message from Micki RileyPramod S Sethi, MD sent at 02/01/2016  8:28 AM EDT ----- Emily RoachKindly inform the patient that complete metabolic panel labs and CBC were both normal

## 2016-02-12 ENCOUNTER — Inpatient Hospital Stay: Admission: RE | Admit: 2016-02-12 | Payer: BC Managed Care – PPO | Source: Ambulatory Visit

## 2016-02-12 ENCOUNTER — Other Ambulatory Visit: Payer: 59

## 2016-02-13 ENCOUNTER — Institutional Professional Consult (permissible substitution): Payer: 59 | Admitting: Physician Assistant

## 2016-02-21 ENCOUNTER — Ambulatory Visit
Admission: RE | Admit: 2016-02-21 | Discharge: 2016-02-21 | Disposition: A | Discharge status: Home / Self Care | Payer: BC Managed Care – PPO | Source: Ambulatory Visit | Attending: Neurology | Admitting: Neurology

## 2016-02-21 DIAGNOSIS — H47332 Pseudopapilledema of optic disc, left eye: Secondary | ICD-10-CM

## 2016-02-21 DIAGNOSIS — G441 Vascular headache, not elsewhere classified: Secondary | ICD-10-CM

## 2016-02-21 DIAGNOSIS — H5712 Ocular pain, left eye: Secondary | ICD-10-CM

## 2016-02-21 DIAGNOSIS — H5462 Unqualified visual loss, left eye, normal vision right eye: Secondary | ICD-10-CM

## 2016-02-21 MED ORDER — GADOBENATE DIMEGLUMINE 529 MG/ML IV SOLN
10.0000 mL | Freq: Once | INTRAVENOUS | Status: AC | PRN
  Administered 2016-02-21: 10 mL via INTRAVENOUS

## 2016-02-27 ENCOUNTER — Telehealth: Payer: Self-pay

## 2016-02-27 NOTE — Telephone Encounter (Signed)
-----   Message from Micki RileyPramod S Sethi, MD sent at 02/27/2016  9:04 AM EST ----- Kindly inform the patient had MRI scan of the orbits was normal

## 2016-02-27 NOTE — Telephone Encounter (Signed)
LM (per DPR) with results. Left call back number for any questions.  

## 2016-02-27 NOTE — Telephone Encounter (Signed)
-----   Message from Micki RileyPramod S Sethi, MD sent at 02/27/2016  9:04 AM EST ----- Consent from the patient MRI scan of the brain was normal. 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

## 2016-03-05 ENCOUNTER — Ambulatory Visit: Payer: 59 | Admitting: Family Medicine

## 2016-04-11 ENCOUNTER — Encounter: Payer: Self-pay | Admitting: Neurology

## 2016-04-11 ENCOUNTER — Ambulatory Visit (INDEPENDENT_AMBULATORY_CARE_PROVIDER_SITE_OTHER): Payer: BC Managed Care – PPO | Admitting: Neurology

## 2016-04-11 VITALS — BP 101/64 | HR 81 | Ht 67.0 in | Wt 164.2 lb

## 2016-04-11 DIAGNOSIS — G43009 Migraine without aura, not intractable, without status migrainosus: Secondary | ICD-10-CM | POA: Diagnosis not present

## 2016-04-11 MED ORDER — ELETRIPTAN HYDROBROMIDE 20 MG PO TABS: 20.0000 mg | ORAL_TABLET | ORAL | 0 refills | Status: DC | PRN

## 2016-04-11 MED ORDER — NORTRIPTYLINE HCL 10 MG PO CAPS: 10.0000 mg | ORAL_CAPSULE | Freq: Every day | ORAL | 3 refills | Status: DC

## 2016-04-11 NOTE — Progress Notes (Signed)
[facility] [address] [place]. [place] [address] [phone]       OFFICE FOLLOW UP VISIT NOTE  Emily. ANANYA Santos Date of Birth:  11-12-1995 Medical Record Number:  [id number]   Referring MD:  Lynnae Prude ,OD Reason for Referral:  Headache and left eye pain HPI: Initial Consult 01/31/16 Emily Ansley is a 1 year Caucasian lady who is accompanied by her mother today for this visit. History is obtained from the patient and review of referral notes. Patient has been complaining of intermittent sharp shooting pain behind the left eye for the last 4-6 weeks. He states there are no specific triggers. The headache can occur several times a day. It can last for hours. It can be disabling. She takes Excedrin which usually gets rid of the pain for over 3040 minutes of so but then the pain returns but is tolerable for several hours. She denies any complaint nausea, vomiting, light or sound sensitivity. There are no specific triggers noted for his pain. This is always been on the left side. The patient states that she's had glasses for vision since childhood and always had some high refractory number in the left eye however for the last 2 months despite prescription of stronger lenses for the left eye she has had trouble with vision acuity and focusing and reading from the left eye. She had a dilated eye exam by optometrist Dr. 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 Patient denies taking excessive vitamins and nutritional supplements. She has an IUD for the last 2 years but does not take birth control pills. She does have history of migraines but they're not quite frequent and usually triggered by weather changes. She feels the current headaches are quite different from her prior migraines. She has not had any recent brain imaging study done. 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 She denies any  double vision, vertigo, gait balance problems. She has never seen a neurologist in the past. Update 04/11/2016 : She returns for follow-up after last visit 2 months ago. She states her eye pain is better and she has not had any significant loss of vision or blurred vision however her headaches seem to be more frequent now. She describes at least 5 days of headaches per week. Headaches are moderate to severe in intensity accompanied by nausea and light and sound sensitivity. She feels better if she lies down. She has been taking 2-4 tablets of migraine Excedrin every day for headaches. She was started on Topamax by me but stopped it after a month even though the headaches will improve because of nausea weight loss and lack of appetite. This has improved after starting Topamax but the headaches have gotten worse. The patient has not been sleeping well at night. She is tired during the day. 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 She is also under significant stress because she is involved in a custody battle for her child. She underwent MRI scan of the brain and orbits both of which I have reviewed personally and appear normal except for small 2 mm low lying cerebellar tonsils with likely an incidental finding. Patient has not been on any other medication like Pamelor and Inderal for headache prophylaxis and has not tried triiptans yet. ROS:   14 system review of systems is  positive for   , eye pain, blurred vision, insomnia, headache and all the systems negative  PMH:  Past Medical History:  Diagnosis Date  . Headache     Social History:  Social History   Social History  . Marital status: Single    Spouse name: N/A  . Number of children: N/A  . Years of education: N/A   Occupational History  . Not on file.   Social History Main Topics  . Smoking status: Never Smoker  . Smokeless tobacco: Never Used  . Alcohol use Not on file  . Drug  use: No  . Sexual activity: Yes   Other Topics Concern  . Not on file   Social History Narrative  . No narrative on file    Medications:   Current Outpatient Prescriptions on File Prior to Visit  Medication Sig Dispense Refill  . acetaminophen (TYLENOL) 325 MG tablet Take 650 mg by mouth every 6 (six) hours as needed for headache.    . Albuterol Sulfate (PROAIR RESPICLICK) 108 (90 Base) MCG/ACT AEPB Inhale 1-2 puffs into the lungs every 4 (four) hours as needed. 1 each 1  . cetirizine (ZYRTEC) 10 MG tablet Take 10 mg by mouth as needed.      No current facility-administered medications on file prior to visit.     Allergies:   Allergies  Allergen Reactions  . Sulfa Antibiotics Rash    Physical Exam General: well developed, well nourished young Caucasian lady, seated, in no evident distress Head: head normocephalic and atraumatic.   Neck: supple with no carotid or supraclavicular bruits Cardiovascular: regular rate and rhythm, no murmurs Musculoskeletal: no deformity Skin:  no rash/petichiae Vascular:  Normal pulses all extremities  Neurologic Exam Mental Status: Awake and fully alert. Oriented to place and time. Recent and remote memory intact. Attention span, concentration and fund of knowledge appropriate. Mood and affect appropriate.  Cranial Nerves: Fundoscopic exam reveals sharp disc margins except for nasal margins of the left disc which is slightly blurred. There is no peripapillary hemorrhages or exudates noted.. Vision acuity is 20/20 on the right and 20/50 on the left. Pupils equal, briskly reactive to light. No afferent pupillary defect noted. Extraocular movements full without nystagmus. Visual fields full to confrontation. Hearing intact. Facial sensation intact. Face, tongue, palate moves normally and symmetrically.  Motor: Normal bulk and tone. Normal strength in all tested extremity muscles. Sensory.: intact to touch , pinprick , position and vibratory sensation.   Coordination: Rapid alternating movements normal in all extremities. Finger-to-nose and heel-to-shin performed accurately bilaterally. Gait and Station: Arises from chair without difficulty. Stance is normal. Gait demonstrates normal stride length and balance . Able to heel, toe and tandem walk without difficulty.  Reflexes: 1+ and symmetric. Toes downgoing.       ASSESSMENT: [age] year Caucasian lady with  recurrent vascular headaches which now seem like migraines headaches with analgesic rebound. 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    PLAN: I had a long discussion with the patient with regards to her head headaches as well as discuss findings on MRI scan of the brain and orbits and answered questions. I recommend she discontinue ibuprofen and excedrin migraine as she may be having a component of analgesic rebound headache. Instead try Relpax 20 mg for symptomatic relief as well as Pamelor 10 mg at night for headache prophylaxis. If she is unable to tolerate this or has persistent frequent headaches may consider trial of Botox at in the  future. I also advised her to participate in regular activities for stress relaxation-like regular exercise, meditation, yoga .greater than 50% time during this 25 minute visit was spent on counseling and coordination of care about migraines, and Jessica rebound headache and answering questions She will return for follow-up in 3 months or call earlier if necessary. Delia Heady, MD  [facility] [address] [place], [place] [address]  Phone [phone] Fax [phone] Note: This document was prepared with digital dictation and possible smart phrase technology. Any transcriptional errors that result from this process are unintentional.

## 2016-04-11 NOTE — Patient Instructions (Addendum)
I had a long discussion with the patient with regards to her head headaches as well as discuss findings on MRI scan of the brain and orbits and answered questions. I recommend she discontinue ibuprofen and excedrin migraine as she may be having a component of analgesic rebound headache. Instead try Relpax 20 mg for symptomatic relief as well as Pamelor 10 mg at night for headache prophylaxis. If she is unable to tolerate this or has persistent frequent headaches may consider trial of Botox at in the future. I also advised her to participate in regular activities for stress relaxation-like regular exercise, meditation, yoga She will return for follow-up in 3 months or call earlier if necessary.   Recurrent Migraine Headache A migraine headache is very bad, throbbing pain on one or both sides of your head. Recurrent migraines keep coming back. Talk to your doctor about what things may bring on (trigger) your migraine headaches. Follow these instructions at home:  Only take medicines as told by your doctor.  Lie down in a dark, quiet room when you have a migraine.  Keep a journal to find out if certain things bring on migraine headaches. For example, write down:  What you eat and drink.  How much sleep you get.  Any change to your diet or medicines.  Lessen how much alcohol you drink.  Quit smoking if you smoke.  Get enough sleep.  Lessen any stress in your life.  Keep lights dim if bright lights bother you or make your migraines worse. Contact a doctor if:  Medicine does not help your migraines.  Your pain keeps coming back.  You have a fever. Get help right away if:  Your migraine becomes really bad.  You have a stiff neck.  You have trouble seeing.  Your muscles are weak, or you lose muscle control.  You lose your balance or have trouble walking.  You feel like you will pass out (faint), or you pass out.  You have really bad symptoms that are different than your first  symptoms. This information is not intended to replace advice given to you by your health care provider. Make sure you discuss any questions you have with your health care provider. Document Released: 01/02/2008 Document Revised: 08/31/2015 Document Reviewed: 11/30/2012 Elsevier Interactive Patient Education  2017 [employer]

## 2016-07-15 ENCOUNTER — Telehealth: Payer: Self-pay | Admitting: Neurology

## 2016-07-15 ENCOUNTER — Ambulatory Visit (INDEPENDENT_AMBULATORY_CARE_PROVIDER_SITE_OTHER): Payer: BC Managed Care – PPO | Admitting: Neurology

## 2016-07-15 ENCOUNTER — Encounter: Payer: Self-pay | Admitting: Neurology

## 2016-07-15 VITALS — BP 103/60 | HR 76 | Wt 159.0 lb

## 2016-07-15 DIAGNOSIS — G43009 Migraine without aura, not intractable, without status migrainosus: Secondary | ICD-10-CM | POA: Diagnosis not present

## 2016-07-15 DIAGNOSIS — G43919 Migraine, unspecified, intractable, without status migrainosus: Secondary | ICD-10-CM | POA: Diagnosis not present

## 2016-07-15 MED ORDER — NORTRIPTYLINE HCL 10 MG PO CAPS: 20.0000 mg | ORAL_CAPSULE | Freq: Every day | ORAL | 3 refills | Status: DC

## 2016-07-15 MED ORDER — ELETRIPTAN HYDROBROMIDE 40 MG PO TABS: 40.0000 mg | ORAL_TABLET | ORAL | 0 refills | Status: DC | PRN

## 2016-07-15 NOTE — Progress Notes (Signed)
[facility] [address] [place].  [id number] [phone]       OFFICE FOLLOW UP VISIT NOTE  Emily Santos Date of Birth:  11/18/1995 Medical Record Number:  [id number]   Referring MD:  Lynnae Prude ,OD Reason for Referral:  Headache and left eye pain HPI: Initial Consult 01/31/16 Emily Santos is a 60 year Caucasian lady who is accompanied by her mother today for this visit. History is obtained from the patient and review of referral notes. Patient has been complaining of intermittent sharp shooting pain behind the left eye for the last 4-6 weeks. He states there are no specific triggers. The headache can occur several times a day. It can last for hours. It can be disabling. She takes Excedrin which usually gets rid of the pain for over 3040 minutes of so but then the pain returns but is tolerable for several hours. She denies any complaint nausea, vomiting, light or sound sensitivity. There are no specific triggers noted for his pain. This is always been on the left side. The patient states that she's had glasses for vision since childhood and always had some high refractory number in the left eye however for the last 2 months despite prescription of stronger lenses for the left eye she has had trouble with vision acuity and focusing and reading from the left eye. She had a dilated eye exam by optometrist Dr. 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 Patient denies taking excessive vitamins and nutritional supplements. She has an IUD for the last 2 years but does not take birth control pills. She does have history of migraines but they're not quite frequent and usually triggered by weather changes. She feels the current headaches are quite different from her prior migraines. She has not had any recent brain imaging study done. 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 She denies any  double vision, vertigo, gait balance problems. She has never seen a neurologist in the past. Update 04/11/2016 : She returns for follow-up after last visit 2 months ago. She states her eye pain is better and she has not had any significant loss of vision or blurred vision however her headaches seem to be more frequent now. She describes at least 5 days of headaches per week. Headaches are moderate to severe in intensity accompanied by nausea and light and sound sensitivity. She feels better if she lies down. She has been taking 2-4 tablets of migraine Excedrin every day for headaches. She was started on Topamax by me but stopped it after a month even though the headaches will improve because of nausea weight loss and lack of appetite. This has improved after starting Topamax but the headaches have gotten worse. The patient has not been sleeping well at night. She is tired during the day. 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 She is also under significant stress because she is involved in a custody battle for her child. She underwent MRI scan of the brain and orbits both of which I have reviewed personally and appear normal except for small 2 mm low lying cerebellar tonsils with likely an incidental finding. Patient has not been on any other medication like Pamelor and Inderal for headache prophylaxis and has not tried triiptans yet. Update 07/15/2016 : She returns for follow-up after last  visit 3 months ago. She is accompanied by her boyfriend. She states that the Relpax does help the headaches but they did not completely go away. She cannot afford to take enough Relpax and hence she takes a couple of Tylenol after a few hours that seems to work. She still has headaches about 3 times a week or so. She is does not identify specific triggers for the headache. She does agree to significant posterior head pain and neck pain and tightness feeling. She has not been  participating in any activities for stress laxation. She is tolerating Pamelor 10 mg at night and is able to sleep well. She denies any excessive daytime sleepiness or dryness of mouth. She has discontinued migraine Excedrin. She admits to significant stress in her life due to the custody battle of children, working full-time as well as being in school ROS:   14 system review of systems is positive for   , eye pain, light sensitivity, appetite change, blurred vision, insomnia, acting out dreams, agitation, back pain, headache and all the systems negative  PMH:  Past Medical History:  Diagnosis Date  . Headache     Social History:  Social History   Social History  . Marital status: Single    Spouse name: N/A  . Number of children: N/A  . Years of education: N/A   Occupational History  . Not on file.   Social History Main Topics  . Smoking status: Never Smoker  . Smokeless tobacco: Never Used  . Alcohol use 2.4 oz/week    4 Cans of beer per week     Comment: social events  . Drug use: No  . Sexual activity: Yes   Other Topics Concern  . Not on file   Social History Narrative  . No narrative on file    Medications:   Current Outpatient Prescriptions on File Prior to Visit  Medication Sig Dispense Refill  . acetaminophen (TYLENOL) 325 MG tablet Take 650 mg by mouth every 6 (six) hours as needed for headache.    . Albuterol Sulfate (PROAIR RESPICLICK) 108 (90 Base) MCG/ACT AEPB Inhale 1-2 puffs into the lungs every 4 (four) hours as needed. 1 each 1  . cetirizine (ZYRTEC) 10 MG tablet Take 10 mg by mouth as needed.      No current facility-administered medications on file prior to visit.     Allergies:   Allergies  Allergen Reactions  . Sulfa Antibiotics Rash    Physical Exam General: well developed, well nourished young Caucasian lady, seated, in no evident distress Head: head normocephalic and atraumatic.   Neck: supple with no carotid or supraclavicular  bruits Cardiovascular: regular rate and rhythm, no murmurs Musculoskeletal: no deformity Skin:  no rash/petichiae Vascular:  Normal pulses all extremities  Neurologic Exam Mental Status: Awake and fully alert. Oriented to place and time. Recent and remote memory intact. Attention span, concentration and fund of knowledge appropriate. Mood and affect appropriate.  Cranial Nerves: Fundoscopic exam reveals sharp disc margins except for nasal margins of the left disc which is slightly blurred. There is no peripapillary hemorrhages or exudates noted.. Vision acuity is 20/20 on the right and 20/50 on the left. Pupils equal, briskly reactive to light. No afferent pupillary defect noted. Extraocular movements full without nystagmus. Visual fields full to confrontation. Hearing intact. Facial sensation intact. Face, tongue, palate moves normally and symmetrically.  Motor: Normal bulk and tone. Normal strength in all tested extremity muscles. Sensory.: intact to touch , pinprick ,  position and vibratory sensation.  Coordination: Rapid alternating movements normal in all extremities. Finger-to-nose and heel-to-shin performed accurately bilaterally. Gait and Station: Arises from chair without difficulty. Stance is normal. Gait demonstrates normal stride length and balance . Able to heel, toe and tandem walk without difficulty.  Reflexes: 1+ and symmetric. Toes downgoing.       ASSESSMENT: 33 year Caucasian lady with  recurrent vascular headaches which now seem like migraines headaches with analgesic rebound. 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 Consider Botox trial after insurance approval and will initiate paperwork for the same. I again advised her to  increase participation in activities for stress relaxation-like regular exercise, swimming, medication and yoga. She'll return for follow-up after Botox approval.Greater than 50% time during this 25 minute visit was spent on counseling and coordination of care about migraines, and analgesic rebound headache and answering questions She will return for follow-up in 3 months or call earlier if necessary. Emily Heady, MD  [facility] [address] [place], [place] [address]  Phone [phone] Fax [phone] Note: This document was prepared with digital dictation and possible smart phrase technology. Any transcriptional errors that result from this process are unintentional.

## 2016-07-15 NOTE — Telephone Encounter (Signed)
Please call pt to schedule Botox appt. Thank you. °

## 2016-07-15 NOTE — Patient Instructions (Signed)
I had a long discussion with the patient and her boyfriend regarding his refractory migraine headaches and recommend increasing the dose of Relpax 40 mg for symptomatic relief and Pamelor to 20 mg at night for prophylaxis. Consider Botox trial after insurance approval and will initiate paperwork for the same. I again advised her to increase participation in activities for stress relaxation-like regular exercise, swimming, medication and yoga. She'll return for follow-up after Botox approval.

## 2017-03-17 ENCOUNTER — Ambulatory Visit: Payer: BC Managed Care – PPO | Admitting: Family Medicine

## 2017-03-19 ENCOUNTER — Telehealth: Payer: Self-pay

## 2017-03-19 NOTE — Telephone Encounter (Signed)
I called pt and rescheduled appt for 03/24/17. She is aware.

## 2017-03-19 NOTE — Telephone Encounter (Signed)
Client [facility] - Client Client Site [facility] - Night Contact Type Call Who Is Calling Patient / Member / Family / Caregiver Caller Name Emily BeckersKatelynn Santos Caller Phone Number [phone] Patient Name Emily BeckersKatelynn Santos Patient DOB [birth year] Call Type Message Only Information Provided Reason for Call Request to Reschedule Office Appointment Initial Comment returning call to office to reschedule appt. Additional Comment Call Closed By: Salvatore Marvelavid Miller Transaction Date/Time: 03/16/2017 12:45:36 PM (ET)

## 2017-03-19 NOTE — Telephone Encounter (Signed)
Patient told that we would call back and reschedule her Establish to Care visit from Monday with Deboraha Sprangebbie Gessner, NP.  Will forward to office staff to contact patient.  Thanks.

## 2017-03-24 ENCOUNTER — Encounter: Payer: Self-pay | Admitting: Family Medicine

## 2017-03-24 ENCOUNTER — Ambulatory Visit: Payer: BC Managed Care – PPO | Admitting: Family Medicine

## 2017-03-24 VITALS — BP 84/42 | HR 78 | Temp 98.1°F | Ht 67.0 in | Wt 155.8 lb

## 2017-03-24 DIAGNOSIS — Z23 Encounter for immunization: Secondary | ICD-10-CM

## 2017-03-24 DIAGNOSIS — R031 Nonspecific low blood-pressure reading: Secondary | ICD-10-CM

## 2017-03-24 DIAGNOSIS — Z7689 Persons encountering health services in other specified circumstances: Secondary | ICD-10-CM | POA: Diagnosis not present

## 2017-03-24 DIAGNOSIS — Z111 Encounter for screening for respiratory tuberculosis: Secondary | ICD-10-CM | POA: Diagnosis not present

## 2017-03-24 NOTE — Addendum Note (Signed)
Addended by: Alvina ChouWALSH, Nolin Grell J on: 03/24/2017 02:37 PM   Modules accepted: Orders

## 2017-03-24 NOTE — Progress Notes (Signed)
Subjective:    Patient ID: Emily Santos, female    DOB: 09-Dec-1995, 21 y.o.   MRN: [id number]  HPI This is a 21 yo female who presents today to establish care, is accompanied by her young son. 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 She needs a form completed today for her externship. She has a boyfriend who was in an MVA earlier this year and had TBI. She has good family support.   Sees gyn annually and has upcoming appointment.   Has history of migraines, has seen neurology in past. Has frequent headaches that are resolved with Excedrin Migraine, does not take any daily preventative. Headaches improved once coursework completed for school     Past Medical History:  Diagnosis Date  . Headache    Past Surgical History:  Procedure Laterality Date  . WISDOM TOOTH EXTRACTION     Family History  Problem Relation Age of Onset  . Hyperlipidemia Father   . Hypothyroidism Father   . Migraines Father   . Hyperthyroidism Paternal Aunt   . Hypertension Paternal Grandmother    Social History   Tobacco Use  . Smoking status: Never Smoker  . Smokeless tobacco: Never Used  Substance Use Topics  . Alcohol use: Yes    Alcohol/week: 2.4 oz    Types: 4 Cans of beer per week    Comment: social events  . Drug use: No      Review of Systems Per HPI    Objective:   Physical Exam  Constitutional: She is oriented to person, place, and time. She appears well-developed and well-nourished. No distress.  HENT:  Head: Normocephalic and atraumatic.  Right Ear: Tympanic membrane, external ear and ear canal normal.  Left Ear: Tympanic membrane, external ear and ear canal normal.  Nose: Nose normal.  Mouth/Throat: Uvula is midline, oropharynx is clear and moist and mucous membranes are normal. No oropharyngeal exudate, posterior oropharyngeal erythema or tonsillar abscesses.  Tonsils +2 (chronic according to patient).   Eyes: Conjunctivae  and EOM are normal. Pupils are equal, round, and reactive to light.  Neck: Normal range of motion. Neck supple.  Cardiovascular: Normal rate, regular rhythm and normal heart sounds.  Pulmonary/Chest: Effort normal and breath sounds normal.  Abdominal: Soft. Bowel sounds are normal.  Musculoskeletal: Normal range of motion. She exhibits no edema.  Lymphadenopathy:    She has no cervical adenopathy.  Neurological: She is alert and oriented to person, place, and time.  Skin: Skin is warm and dry. She is not diaphoretic.  Psychiatric: She has a normal mood and affect. Her behavior is normal. Judgment and thought content normal.  Vitals reviewed.   BP (!) 84/42 (BP Location: Right Arm, Patient Position: Sitting, Cuff Size: Normal)   Pulse 78   Temp 98.1 F (36.7 C) (Oral)   Ht 5' 7"  (1.702 m)   Wt 155 lb 12 oz (70.6 kg)   LMP 02/22/2017   SpO2 97%   BMI 24.39 kg/m  BP Readings from Last 3 Encounters:  03/24/17 (!) 84/42  07/15/16 103/60  04/11/16 101/64       Assessment & Plan:  1. Encounter to establish care - Discussed and encouraged healthy lifestyle choices- adequate sleep, regular exercise, stress management and healthy food choices.   2. Screening for tuberculosis - Quantiferon tb gold assay  3. Flu vaccine need - Flu Vaccine QUAD 36+ mos IM  4. Incidental finding of low  blood pressure - she denies dizziness, light headedness or recent cold/flu symptoms. Has eaten and drank today.   - completed form as able, she is picking up records from ob/gyn and will let me know if she needs MMR/varicella titers.    Clarene Reamer, FNP-BC  [facility]  03/24/2017 12:11 PM

## 2017-03-24 NOTE — Patient Instructions (Addendum)
It was a pleasure to meet you today! I look forward to partnering with you for your health care needs   Check your ob records and see if you have been tested for immunity for measles/mumps/rubella and varicella- if not, let me know and I will order the blood work and you can have a lab only visit.    I will notify you of TB blood test results, you can print off Mychart if you sign up.

## 2017-03-26 LAB — QUANTIFERON-TB GOLD PLUS
Mitogen-NIL: >10 IU/mL
NIL: 0 IU/mL
QuantiFERON-TB Gold Plus: NEGATIVE
TB1-NIL: 0.02 IU/mL
TB2-NIL: 0.01 IU/mL

## 2017-09-08 ENCOUNTER — Ambulatory Visit (INDEPENDENT_AMBULATORY_CARE_PROVIDER_SITE_OTHER): Payer: BC Managed Care – PPO | Admitting: Family Medicine

## 2017-09-08 ENCOUNTER — Encounter: Payer: Self-pay | Admitting: Family Medicine

## 2017-09-08 VITALS — BP 108/68 | HR 77 | Temp 98.4°F | Ht 67.0 in | Wt 157.0 lb

## 2017-09-08 DIAGNOSIS — R51 Headache: Secondary | ICD-10-CM

## 2017-09-08 DIAGNOSIS — B360 Pityriasis versicolor: Secondary | ICD-10-CM

## 2017-09-08 DIAGNOSIS — R519 Headache, unspecified: Secondary | ICD-10-CM

## 2017-09-08 MED ORDER — KETOCONAZOLE 2 % EX SHAM: 1.0000 "application " | MEDICATED_SHAMPOO | Freq: Every day | CUTANEOUS | 1 refills | Status: DC

## 2017-09-08 MED ORDER — DICLOFENAC SODIUM 75 MG PO TBEC: 75.0000 mg | DELAYED_RELEASE_TABLET | Freq: Two times a day (BID) | ORAL | 0 refills | Status: DC | PRN

## 2017-09-08 NOTE — Progress Notes (Signed)
Subjective:    Patient ID: Emily Santos, female    DOB: 1995/10/02, 22 y.o.   MRN: [id number]  HPI This is a 22 yo female who presents today with increased headaches. These are different from her migraines which have been rare..   Was at [employer] on a ride this past weekend when she had a temporary loss of vision followed by a migraine with light and sound sensitivity. Took acetaminophen and ibuprofen, migraine resolved.  Headaches have been a couple of times a week for several weeks, not typical migraine. Typically on left side of head, some improvement with acetaminophen and ibuprofen 400 mg but not complete resolution. . Sleeping well, about 7-8 hours of sleep nightly. Has had several years of problems with left eye and saw optho and then neuro. Last eye exam about 1 year ago. Headaches with some nausea, no vomiting, little light/soud sensitivity. Has seen neuro in past during bad time of stress and was having frequent migraines. Reports that migraines eased off and she no longer requires preventative or abortive medication.  Currently no headache.   Rash on back, has had in past, treated with cream and body shampoo, not sure of diagnosis or name of treatment.   Currently working at [employer]. Likes her job, stress level not high.   Past Medical History:  Diagnosis Date  . Headache    Past Surgical History:  Procedure Laterality Date  . WISDOM TOOTH EXTRACTION     Family History  Problem Relation Age of Onset  . Hyperlipidemia Father   . Hypothyroidism Father   . Migraines Father   . Hyperthyroidism Paternal Aunt   . Hypertension Paternal Grandmother    Social History   Tobacco Use  . Smoking status: Never Smoker  . Smokeless tobacco: Never Used  Substance Use Topics  . Alcohol use: Yes    Alcohol/week: 2.4 oz    Types: 4 Cans of beer per week    Comment: social events  . Drug use: No      Review of Systems Per HPI    Objective:   Physical Exam    Constitutional: She is oriented to person, place, and time. She appears well-developed and well-nourished. No distress.  HENT:  Head: Normocephalic and atraumatic.  Eyes: Pupils are equal, round, and reactive to light. Conjunctivae and EOM are normal.  Neck: Normal range of motion. Neck supple.  Cardiovascular: Normal rate and regular rhythm.  Pulmonary/Chest: Effort normal and breath sounds normal.  Musculoskeletal: Normal range of motion. She exhibits no edema.  Lymphadenopathy:    She has no cervical adenopathy.  Neurological: She is alert and oriented to person, place, and time. She displays normal reflexes. No cranial nerve deficit. Coordination normal.  Skin: Skin is warm and dry. She is not diaphoretic.  Back and chest with areas of scattered hypopigmentation and areas of hyperpigmentation.   Psychiatric: She has a normal mood and affect. Her behavior is normal. Judgment and thought content normal.  Vitals reviewed.     BP 108/68 (BP Location: Right Arm, Patient Position: Sitting, Cuff Size: Normal)   Pulse 77   Temp 98.4 F (36.9 C) (Oral)   Ht 5\' 7"  (1.702 m)   Wt 157 lb (71.2 kg)   LMP 08/18/2017   SpO2 98%   BMI 24.59 kg/m  Wt Readings from Last 3 Encounters:  09/08/17 157 lb (71.2 kg)  03/24/17 155 lb 12 oz (70.6 kg)  07/15/16 159 lb (72.1 kg)  Assessment & Plan:  1. Nonintractable episodic headache, unspecified headache type - currently without headache, will try diclofenac if returns - asked her to keep a headache log and bring to next appointment if headaches persist - diclofenac (VOLTAREN) 75 MG EC tablet; Take 1 tablet (75 mg total) by mouth 2 (two) times daily as needed.  Dispense: 30 tablet; Refill: 0  2. Tinea versicolor - ketoconazole (NIZORAL) 2 % shampoo; Apply 1 application topically daily. For three days.  Dispense: 120 mL; Refill: 1   Olean Reeeborah Gessner, FNP-BC  [facility]  09/09/2017  1:25 PM

## 2017-09-08 NOTE — Patient Instructions (Addendum)
Good to see you today I have sent in a prescription for an NSAID for headache, you can take twice a day, don't take with other NSAIDs (Alleve, ibuprofen) I have sent in an antifungal shampoo, use daily for 3 days, this should clear your tinea versicolor  Tinea Versicolor Tinea versicolor is a skin infection that is caused by a type of yeast. It causes a rash that shows up as light or dark patches on the skin. It often occurs on the chest, back, neck, or upper arms. The condition usually does not cause other problems. In most cases, it goes away in a few weeks with treatment. The infection cannot be spread by person to another person. Follow these instructions at home:  Take medicines only as told by your doctor.  Scrub your skin every day with a dandruff shampoo as told by your doctor.  Do not scratch your skin in the rash area.  Avoid places that are hot and humid.  Do not use tanning booths.  Try to avoid sweating a lot. Contact a doctor if:  Your symptoms get worse.  You have a fever.  You have redness, swelling, or pain in the area of your rash.  You have fluid, blood, or pus coming from your rash.  Your rash comes back after treatment. This information is not intended to replace advice given to you by your health care provider. Make sure you discuss any questions you have with your health care provider. Document Released: 03/07/2008 Document Revised: 11/26/2015 Document Reviewed: 01/04/2014 Elsevier Interactive Patient Education  2018 [employer]

## 2017-09-09 ENCOUNTER — Encounter: Payer: Self-pay | Admitting: Family Medicine

## 2017-11-10 ENCOUNTER — Encounter: Payer: Self-pay | Admitting: Family Medicine

## 2017-11-10 ENCOUNTER — Ambulatory Visit: Payer: BC Managed Care – PPO | Admitting: Family Medicine

## 2017-11-10 VITALS — BP 102/76 | HR 73 | Temp 98.1°F | Ht 67.0 in | Wt 155.0 lb

## 2017-11-10 DIAGNOSIS — L700 Acne vulgaris: Secondary | ICD-10-CM

## 2017-11-10 MED ORDER — NORETHINDRON-ETHINYL ESTRAD-FE 1-20/1-30/1-35 MG-MCG PO TABS: 1.0000 | ORAL_TABLET | Freq: Every day | ORAL | 4 refills | Status: DC

## 2017-11-10 NOTE — Patient Instructions (Addendum)
Can try Diffren over the counter nightly once you are out of your Epiduo.   If you want to try a different soap- Cetaphil, Dove Gentle- bars have ph that is close to the skin  I have sent in a prescriptions for oral contraceptive pill- may take several months to work, take one tablet daily  Be in touch if you have any problems or questions    Acne Acne is a skin problem that causes pimples. Acne occurs when the pores in the skin get blocked. The pores may become infected with bacteria, or they may become red, sore, and swollen. Acne is a common skin problem, especially for teenagers. Acne usually goes away over time. What are the causes? Each pore contains an oil gland. Oil glands make an oily substance that is called sebum. Acne happens when these glands get plugged with sebum, dead skin cells, and dirt. Then, the bacteria that are normally found in the oil glands multiply and cause inflammation. Acne is commonly triggered by changes in your hormones. These hormonal changes can cause the oil glands to get bigger and to make more sebum. Factors that can make acne worse include:  Hormone changes during: ? Adolescence. ? Women's menstrual cycles. ? Pregnancy.  Oil-based cosmetics and hair products.  Harshly scrubbing the skin.  Strong soaps.  Stress.  Hormone problems that are due to certain diseases.  Long or oily hair rubbing against the skin.  Certain medicines.  Pressure from headbands, backpacks, or shoulder pads.  Exposure to certain oils and chemicals.  What increases the risk? This condition is more likely to develop in:  Teenagers.  People who have a family history of acne.  What are the signs or symptoms? Acne often occurs on the face, neck, chest, and upper back. Symptoms include:  Small, red bumps (pimples or papules).  Whiteheads.  Blackheads.  Small, pus-filled pimples (pustules).  Big, red pimples or pustules that feel tender.  More severe acne  can cause:  An infected area that contains a collection of pus (abscess).  Hard, painful, fluid-filled sacs (cysts).  Scars.  How is this diagnosed? This condition is diagnosed with a medical history and physical exam. Blood tests may also be done. How is this treated? Treatment for this condition can vary depending on the severity of your acne. Treatment may include:  Creams and lotions that prevent oil glands from clogging.  Creams and lotions that treat or prevent infections and inflammation.  Antibiotic medicines that are applied to the skin or taken as a pill.  Pills that decrease sebum production.  Birth control pills.  Light or laser treatments.  Surgery.  Injections of medicine into the affected areas.  Chemicals that cause peeling of the skin.  Your health care provider will also recommend the best way to take care of your skin. Good skin care is the most important part of treatment. Follow these instructions at home: Skin care Take care of your skin as told by your health care provider. You may be told to do these things:  Wash your skin gently at least two times each day, as well as: ? After you exercise. ? Before you go to bed.  Use mild soap.  Apply a water-based skin moisturizer after you wash your skin.  Use a sunscreen or sunblock with SPF 30 or greater. This is especially important if you are using acne medicines.  Choose cosmetics that will not plug your oil glands (are noncomedogenic).  Medicines  Take  over-the-counter and prescription medicines only as told by your health care provider.  If you were prescribed an antibiotic medicine, apply or take it as told by your health care provider. Do not stop taking the antibiotic even if your condition improves. General instructions  Keep your hair clean and off of your face. If you have oily hair, shampoo your hair regularly or daily.  Avoid leaning your chin or forehead against your  hands.  Avoid wearing tight headbands or hats.  Avoid picking or squeezing your pimples. That can make your acne worse and cause scarring.  Keep all follow-up visits as told by your health care provider. This is important.  Shave gently and only when necessary.  Keep a food journal to figure out if any foods are linked with your acne. Contact a health care provider if:  Your acne is not better after eight weeks.  Your acne gets worse.  You have a large area of skin that is red or tender.  You think that you are having side effects from any acne medicine. This information is not intended to replace advice given to you by your health care provider. Make sure you discuss any questions you have with your health care provider. Document Released: 03/22/2000 Document Revised: 11/24/2015 Document Reviewed: 06/01/2014 Elsevier Interactive Patient Education  [employer].

## 2017-11-10 NOTE — Progress Notes (Signed)
   Subjective:    Patient ID: Emily CalicoKatelynn N Hays, female    DOB: [birth year], 22 y.o.   MRN: [id number]  HPI This is a 22 yo female who presents today with acne x several years. Has history of Tinea Versicolor, some improvement with ketoconazole shampoo.  Has been on Aczone and Epiduo for long time and they don't seem to work as well as they did initially.   Uses Nu skin face wash, twice a day and occasional scrub. Always removes make up.  Currently working and plans to get BSN from [employer], starting in spring.  Has history of migraine without aura, no personal or family history of clotting disorder/DVT/PE. Non smoker. Has had Mirena IUD x 4 years.    Past Medical History:  Diagnosis Date  . Headache    Past Surgical History:  Procedure Laterality Date  . WISDOM TOOTH EXTRACTION     Family History  Problem Relation Age of Onset  . Hyperlipidemia Father   . Hypothyroidism Father   . Migraines Father   . Hyperthyroidism Paternal Aunt   . Hypertension Paternal Grandmother    Social History   Tobacco Use  . Smoking status: Never Smoker  . Smokeless tobacco: Never Used  Substance Use Topics  . Alcohol use: Yes    Alcohol/week: 2.4 oz    Types: 4 Cans of beer per week    Comment: social events  . Drug use: No      Review of Systems Per HPI    Objective:   Physical Exam  Constitutional: She appears well-developed and well-nourished.  HENT:  Head: Normocephalic and atraumatic.  Eyes: Conjunctivae are normal.  Cardiovascular: Normal rate.  Pulmonary/Chest: Effort normal.  Skin: Skin is warm and dry.  Cheeks/nose with open/closed comedomes. Irregular texture. No inflamed lesions.   Psychiatric: She has a normal mood and affect. Her behavior is normal. Judgment and thought content normal.  Vitals reviewed.     BP 102/76 (BP Location: Right Arm, Patient Position: Sitting, Cuff Size: Normal)   Pulse 73   Temp 98.1 F (36.7 C) (Oral)   Ht 5\' 7"  (1.702 m)   Wt 155 lb  (70.3 kg)   SpO2 98%   BMI 24.28 kg/m  Wt Readings from Last 3 Encounters:  11/10/17 155 lb (70.3 kg)  09/08/17 157 lb (71.2 kg)  03/24/17 155 lb 12 oz (70.6 kg)        Assessment & Plan:  1. Acne vulgaris - Provided written and verbal information regarding diagnosis and treatment. - Discussed adding OCP and patient agreeable. Discussed expectations of treatment.  - Discussed minor changes in skin care regimen and provided information - norethindrone-ethinyl estradiol-iron (ESTROSTEP FE,TILIA FE,TRI-LEGEST FE) 1-20/1-30/1-35 MG-MCG tablet; Take 1 tablet by mouth daily.  Dispense: 3 Package; Refill: 4 - follow up in 4-6 months if no improvement.   Olean Reeeborah Gessner, FNP-BC  [facility]  11/10/2017 9:21 AM

## 2018-04-22 ENCOUNTER — Encounter: Payer: BC Managed Care – PPO | Admitting: Family Medicine

## 2018-04-24 ENCOUNTER — Encounter: Payer: Self-pay | Admitting: Physician Assistant

## 2018-04-24 ENCOUNTER — Ambulatory Visit (INDEPENDENT_AMBULATORY_CARE_PROVIDER_SITE_OTHER): Payer: No Typology Code available for payment source | Admitting: Physician Assistant

## 2018-04-24 VITALS — BP 107/66 | HR 72 | Wt 153.4 lb

## 2018-04-24 DIAGNOSIS — G4440 Drug-induced headache, not elsewhere classified, not intractable: Secondary | ICD-10-CM | POA: Diagnosis not present

## 2018-04-24 DIAGNOSIS — G43009 Migraine without aura, not intractable, without status migrainosus: Secondary | ICD-10-CM

## 2018-04-24 MED ORDER — SUMATRIPTAN SUCCINATE 100 MG PO TABS: 100.0000 mg | ORAL_TABLET | Freq: Once | ORAL | 11 refills | Status: DC | PRN

## 2018-04-24 MED ORDER — GALCANEZUMAB-GNLM 120 MG/ML ~~LOC~~ SOAJ: SUBCUTANEOUS | 11 refills | Status: DC

## 2018-04-24 MED ORDER — CYCLOBENZAPRINE HCL 10 MG PO TABS
10.0000 mg | ORAL_TABLET | Freq: Three times a day (TID) | ORAL | 2 refills | Status: DC | PRN
Start: 2018-04-24 — End: 2018-12-16

## 2018-04-24 NOTE — Progress Notes (Signed)
History:  Emily Santos is a 23 y.o. G1P1001 who presents to clinic today for new evaluation of headache.  Currently she is on day 4 of a continuous headache.  She started first getting headaches at age 56. They've been worse during stressful periods of her life.  Currently she is not stressed but states they are as bad as ever.  They get to be severe, are located behind left eye or bilat frontal and spreading to the entire head.  There is throbbing, feels like pressure with sharp pain at times.  It can last for several hours to several days.  Noises and Lights make it worse, movement makes it worse.  There is nausea and vomiting.  There is no warning.  It worsens to severe within an hour or 2.  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    HIT6:73 Number of days in the last 4 weeks with:  Severe headache: 6 Moderate headache: 3 Mild headache: 5  No headache: 14   Past Medical History:  Diagnosis Date  . Headache     Social History   Socioeconomic History  . Marital status: Single    Spouse name: Not on file  . Number of children: Not on file  . Years of education: Not on file  . Highest education level: Not on file  Occupational History  . Not on file  Social Needs  . Financial resource strain: Not on file  . Food insecurity:    Worry: Not on file    Inability: Not on file  . Transportation needs:    Medical: Not on file    Non-medical: Not on file  Tobacco Use  . Smoking status: Never Smoker  . Smokeless tobacco: Never Used  Substance and Sexual Activity  . Alcohol use: Yes    Alcohol/week: 4.0 standard drinks    Types: 4 Cans of beer per week    Comment: social events  . Drug use: No  . Sexual activity: Yes  Lifestyle  . Physical activity:    Days per week: Not on file    Minutes per session: Not on file  . Stress: Not on file  Relationships  .  Social connections:    Talks on phone: Not on file    Gets together: Not on file    Attends religious service: Not on file    Active member of club or organization: Not on file    Attends meetings of clubs or organizations: Not on file    Relationship status: Not on file  . Intimate partner violence:    Fear of current or ex partner: Not on file    Emotionally abused: Not on file    Physically abused: Not on file    Forced sexual activity: Not on file  Other Topics Concern  . Not on file  Social History Narrative  . Not on file    Family History  Problem Relation Age of Onset  . Hyperlipidemia Father   . Hypothyroidism Father   . Migraines Father   . Hyperthyroidism Paternal Aunt   . Hypertension Paternal Grandmother     Allergies  Allergen Reactions  . Sulfa Antibiotics Rash    Current Outpatient Medications on File Prior to Visit  Medication Sig Dispense Refill  . Albuterol Sulfate (PROAIR RESPICLICK) 108 (90 Base) MCG/ACT AEPB Inhale 1-2 puffs into the lungs every  4 (four) hours as needed. 1 each 1  . cetirizine (ZYRTEC) 10 MG tablet Take 10 mg by mouth as needed.     . diclofenac (VOLTAREN) 75 MG EC tablet Take 1 tablet (75 mg total) by mouth 2 (two) times daily as needed. 30 tablet 0  . ketoconazole (NIZORAL) 2 % shampoo Apply 1 application topically daily. For three days. 120 mL 1  . norethindrone-ethinyl estradiol-iron (ESTROSTEP FE,TILIA FE,TRI-LEGEST FE) 1-20/1-30/1-35 MG-MCG tablet Take 1 tablet by mouth daily. 3 Package 4   No current facility-administered medications on file prior to visit.      Review of Systems:  All pertinent positive/negative included in HPI, all other review of systems are negative   Objective:  Physical Exam BP 107/66   Pulse 72   Wt 153 lb 6.4 oz (69.6 kg)   BMI 24.03 kg/m  CONSTITUTIONAL: Well-developed, well-nourished female in no acute distress.  EYES: EOM intact ENT: Normocephalic CARDIOVASCULAR: Regular rate    RESPIRATORY: Normal rate. MUSCULOSKELETAL: Normal ROM SKIN: Warm, dry without erythema  NEUROLOGICAL: Alert, oriented, CN II-XII grossly intact, Appropriate balance PSYCH: Normal behavior, mood   Assessment & Plan:  Assessment: 1. Migraine without aura and without status migrainosus, not intractable   2. Medication overuse headache      worsening   Plan: Begin Emgality for migraine prevention.  Information, instructions, sample provided. Pt to see Ophthalmology to eval Use Sumatriptan 100mg  for acute migraine - may repeat in 2 hours - max of 2/day, max of 2 days per week Ibuprofen prn up to 2 days per week - enhances sumatriptan Stop all excedrin, tylenol Flexeril prn HA, muscle spasm, sleep difficulty - sedation precautions given = may use more liberally than ibuprofen or sumatriptan Follow-up in 3 months or sooner PRN  Bertram Denver, PA-C 04/24/2018 10:20 AM

## 2018-04-24 NOTE — Patient Instructions (Signed)

## 2018-04-29 ENCOUNTER — Encounter: Payer: Self-pay | Admitting: Radiology

## 2018-05-06 ENCOUNTER — Encounter: Payer: Self-pay | Admitting: Family Medicine

## 2018-05-06 ENCOUNTER — Ambulatory Visit (INDEPENDENT_AMBULATORY_CARE_PROVIDER_SITE_OTHER): Payer: No Typology Code available for payment source | Admitting: Family Medicine

## 2018-05-06 VITALS — BP 113/71 | HR 71 | Ht 66.0 in | Wt 152.2 lb

## 2018-05-06 DIAGNOSIS — Z113 Encounter for screening for infections with a predominantly sexual mode of transmission: Secondary | ICD-10-CM | POA: Diagnosis not present

## 2018-05-06 DIAGNOSIS — N898 Other specified noninflammatory disorders of vagina: Secondary | ICD-10-CM

## 2018-05-06 DIAGNOSIS — N920 Excessive and frequent menstruation with regular cycle: Secondary | ICD-10-CM

## 2018-05-06 DIAGNOSIS — B9689 Other specified bacterial agents as the cause of diseases classified elsewhere: Secondary | ICD-10-CM

## 2018-05-06 DIAGNOSIS — Z124 Encounter for screening for malignant neoplasm of cervix: Secondary | ICD-10-CM

## 2018-05-06 DIAGNOSIS — Z01419 Encounter for gynecological examination (general) (routine) without abnormal findings: Secondary | ICD-10-CM

## 2018-05-06 DIAGNOSIS — N760 Acute vaginitis: Secondary | ICD-10-CM

## 2018-05-06 DIAGNOSIS — R42 Dizziness and giddiness: Secondary | ICD-10-CM

## 2018-05-06 MED ORDER — MEDROXYPROGESTERONE ACETATE 150 MG/ML IM SUSP: 150.0000 mg | INTRAMUSCULAR | 4 refills | Status: DC

## 2018-05-06 NOTE — Progress Notes (Signed)
-   STI testing today 

## 2018-05-06 NOTE — Patient Instructions (Signed)
Preventive Care 18-39 Years, Female Preventive care refers to lifestyle choices and visits with your health care provider that can promote health and wellness. What does preventive care include?   A yearly physical exam. This is also called an annual well check.  Dental exams once or twice a year.  Routine eye exams. Ask your health care provider how often you should have your eyes checked.  Personal lifestyle choices, including: ? Daily care of your teeth and gums. ? Regular physical activity. ? Eating a healthy diet. ? Avoiding tobacco and drug use. ? Limiting alcohol use. ? Practicing safe sex. ? Taking vitamin and mineral supplements as recommended by your health care provider. What happens during an annual well check? The services and screenings done by your health care provider during your annual well check will depend on your age, overall health, lifestyle risk factors, and family history of disease. Counseling Your health care provider may ask you questions about your:  Alcohol use.  Tobacco use.  Drug use.  Emotional well-being.  Home and relationship well-being.  Sexual activity.  Eating habits.  Work and work environment.  Method of birth control.  Menstrual cycle.  Pregnancy history. Screening You may have the following tests or measurements:  Height, weight, and BMI.  Diabetes screening. This is done by checking your blood sugar (glucose) after you have not eaten for a while (fasting).  Blood pressure.  Lipid and cholesterol levels. These may be checked every 5 years starting at age 20.  Skin check.  Hepatitis C blood test.  Hepatitis B blood test.  Sexually transmitted disease (STD) testing.  BRCA-related cancer screening. This may be done if you have a family history of breast, ovarian, tubal, or peritoneal cancers.  Pelvic exam and Pap test. This may be done every 3 years starting at age 21. Starting at age 30, this may be done every 5  years if you have a Pap test in combination with an HPV test. Discuss your test results, treatment options, and if necessary, the need for more tests with your health care provider. Vaccines Your health care provider may recommend certain vaccines, such as:  Influenza vaccine. This is recommended every year.  Tetanus, diphtheria, and acellular pertussis (Tdap, Td) vaccine. You may need a Td booster every 10 years.  Varicella vaccine. You may need this if you have not been vaccinated.  HPV vaccine. If you are 26 or younger, you may need three doses over 6 months.  Measles, mumps, and rubella (MMR) vaccine. You may need at least one dose of MMR. You may also need a second dose.  Pneumococcal 13-valent conjugate (PCV13) vaccine. You may need this if you have certain conditions and were not previously vaccinated.  Pneumococcal polysaccharide (PPSV23) vaccine. You may need one or two doses if you smoke cigarettes or if you have certain conditions.  Meningococcal vaccine. One dose is recommended if you are age 19-21 years and a first-year college student living in a residence hall, or if you have one of several medical conditions. You may also need additional booster doses.  Hepatitis A vaccine. You may need this if you have certain conditions or if you travel or work in places where you may be exposed to hepatitis A.  Hepatitis B vaccine. You may need this if you have certain conditions or if you travel or work in places where you may be exposed to hepatitis B.  Haemophilus influenzae type b (Hib) vaccine. You may need this if you   have certain risk factors. Talk to your health care provider about which screenings and vaccines you need and how often you need them. This information is not intended to replace advice given to you by your health care provider. Make sure you discuss any questions you have with your health care provider. Document Released: 05/21/2001 Document Revised: 11/05/2016  Document Reviewed: 01/24/2015 Elsevier Interactive Patient Education  2019 [employer].

## 2018-05-06 NOTE — Progress Notes (Signed)
   GYNECOLOGY ANNUAL PREVENTATIVE CARE ENCOUNTER NOTE  Subjective:   Emily Santos is a 23 y.o. G65P1001 female here for a routine annual gynecologic exam.  Current complaints:  Migraines- on emgality. Reports migraines with aura. Menorrhagia- menses is 7days, has clots and heavy bleeding for 4-5 days.   Denies abnormal vaginal bleeding, discharge, pelvic pain, problems with intercourse or other gynecologic concerns.    Gynecologic History Patient's last menstrual period was 04/28/2018. Contraception: IUD Last Pap: "years ago". Results were: normal per report Last mammogram: NA  The following portions of the patient's history were reviewed and updated as appropriate: allergies, current medications, past family history, past medical history, past social history, past surgical history and problem list.  Review of Systems Pertinent items are noted in HPI.   Objective:  BP 113/71   Pulse 71   Ht 5\' 6"  (1.676 m)   Wt 152 lb 3.2 oz (69 kg)   LMP 04/28/2018   BMI 24.57 kg/m  CONSTITUTIONAL: Well-developed, well-nourished female in no acute distress.  HENT:  Normocephalic, atraumatic, External right and left ear normal. Oropharynx is clear and moist EYES:  No scleral icterus.  NECK: Normal range of motion, supple, no masses.  Normal thyroid.  SKIN: Skin is warm and dry. No rash noted. Not diaphoretic. No erythema. No pallor. NEUROLOGIC: Alert and oriented to person, place, and time. Normal reflexes, muscle tone coordination. No cranial nerve deficit noted. PSYCHIATRIC: Normal mood and affect. Normal behavior. Normal judgment and thought content. CARDIOVASCULAR: Normal heart rate noted, regular rhythm. 2+ distal pulses. RESPIRATORY: Effort and breath sounds normal, no problems with respiration noted. BREASTS: Symmetric in size. No masses, skin changes, nipple drainage, or lymphadenopathy. ABDOMEN: Soft,  no distention noted.  No tenderness, rebound or guarding.  PELVIC: Normal  appearing external genitalia; normal appearing vaginal mucosa and cervix.  No abnormal discharge noted.  Pap smear obtained.  Normal uterine size, no other palpable masses, no uterine or adnexal tenderness. MUSCULOSKELETAL: Normal range of motion.    Assessment and Plan:  1) Annual gynecologic examination with pap smear:  Will follow up results of pap smear and manage accordingly. STI screen also ordered today.  Routine preventative health maintenance measures emphasized.  2) Contraception counseling: Reviewed all forms of birth control options available including abstinence; over the counter/barrier methods; hormonal contraceptive medication including pill, patch, ring, injection,contraceptive implant; hormonal and nonhormonal IUDs; permanent sterilization options including vasectomy and the various tubal sterilization modalities. Risks and benefits reviewed.  Questions were answered.  Written information was also given to the patient to review.  Patient desires IUD-- currently at 5 years in place. She will schedule a IUD removal/reinsertion in the next 2-3 weeks. She was told to call with any further questions, or with any concerns about this method of contraception.  Emphasized use of condoms 100% of the time for STI prevention.  3) Menorrhagia - Not well controlled on LNG-IUD. Patient was placed on estrogen containing OCP by PCP. Counseled patient on the risks associated with estrogen in the setting of complex migraines.  - I recommended changing to depo to help with bleeding pattern and combined OCPs - If depo is not well tolerated, recommend progesterone only pills   Please refer to After Visit Summary for other counseling recommendations.   Return in about 2 weeks (around 05/20/2018) for IUD removal/insertion AND Depo administation.  Federico Flake, MD, MPH, ABFM Attending Physician Faculty Practice- Center for [facility]

## 2018-05-06 NOTE — Progress Notes (Signed)
Last pap?  STD testing today

## 2018-05-07 LAB — CBC
Hematocrit: 39.4 % (ref 34.0–46.6)
Hemoglobin: 13.6 g/dL (ref 11.1–15.9)
MCH: 31.5 pg (ref 26.6–33.0)
MCHC: 34.5 g/dL (ref 31.5–35.7)
MCV: 91 fL (ref 79–97)
Platelets: 304 10*3/uL (ref 150–450)
RBC: 4.32 x10E6/uL (ref 3.77–5.28)
RDW: 11.7 % (ref 11.7–15.4)
WBC: 7.3 10*3/uL (ref 3.4–10.8)

## 2018-05-07 LAB — HIV ANTIBODY (ROUTINE TESTING W REFLEX): HIV Screen 4th Generation wRfx: NONREACTIVE

## 2018-05-07 LAB — TSH: TSH: 1.85 u[IU]/mL (ref 0.450–4.500)

## 2018-05-07 LAB — RPR: RPR Ser Ql: NONREACTIVE

## 2018-05-13 LAB — CYTOLOGY - PAP
Bacterial vaginitis: POSITIVE — AB
Candida vaginitis: NEGATIVE
Chlamydia: NEGATIVE
Neisseria Gonorrhea: NEGATIVE
Trichomonas: NEGATIVE

## 2018-05-18 ENCOUNTER — Other Ambulatory Visit: Payer: Self-pay

## 2018-05-18 MED ORDER — METRONIDAZOLE 500 MG PO TABS: 500.0000 mg | ORAL_TABLET | Freq: Two times a day (BID) | ORAL | 0 refills | Status: DC

## 2018-05-18 NOTE — Telephone Encounter (Signed)
Patient had abnormal low grade cells on pap. She will need to follow up in one year for a repeat pap. Patient also has BV I have called in flagyl to help with her symptoms. She should be advised to not drink any alcohol while using this medications. Patient voice understanding at this time.

## 2018-05-20 ENCOUNTER — Ambulatory Visit (INDEPENDENT_AMBULATORY_CARE_PROVIDER_SITE_OTHER): Payer: No Typology Code available for payment source | Admitting: Family Medicine

## 2018-05-20 ENCOUNTER — Encounter: Payer: Self-pay | Admitting: Family Medicine

## 2018-05-20 VITALS — BP 108/78 | HR 71 | Wt 150.0 lb

## 2018-05-20 DIAGNOSIS — Z30430 Encounter for insertion of intrauterine contraceptive device: Secondary | ICD-10-CM | POA: Diagnosis not present

## 2018-05-20 DIAGNOSIS — N939 Abnormal uterine and vaginal bleeding, unspecified: Secondary | ICD-10-CM | POA: Diagnosis not present

## 2018-05-20 DIAGNOSIS — Z30432 Encounter for removal of intrauterine contraceptive device: Secondary | ICD-10-CM

## 2018-05-20 DIAGNOSIS — Z3202 Encounter for pregnancy test, result negative: Secondary | ICD-10-CM | POA: Diagnosis not present

## 2018-05-20 LAB — POCT URINE PREGNANCY: Preg Test, Ur: NEGATIVE

## 2018-05-20 MED ORDER — LEVONORGESTREL 19.5 MCG/DAY IU IUD
INTRAUTERINE_SYSTEM | Freq: Once | INTRAUTERINE | Status: AC
  Administered 2018-05-20: 12:00:00 via INTRAUTERINE

## 2018-05-20 MED ORDER — MEDROXYPROGESTERONE ACETATE 150 MG/ML IM SUSP
150.0000 mg | Freq: Once | INTRAMUSCULAR | Status: AC
  Administered 2018-05-20: 150 mg via INTRAMUSCULAR

## 2018-05-20 NOTE — Progress Notes (Signed)
   GYNECOLOGY CLINIC PROCEDURE NOTE  Emily Santos is a 23 y.o. G1P1001 here for  IUD: Liletta IUD insertion. No GYN concerns.  Last pap smear was on 05/06/2018 and was LSIL with plan to repeat in 1 year.  IUD Insertion Procedure Note Patient identified, informed consent performed, consent signed.   Discussed risks of irregular bleeding, cramping, infection, malpositioning or misplacement of the IUD outside the uterus which may require further procedure such as laparoscopy. Time out was performed.  Urine pregnancy test negative.  Speculum placed in the vagina.  Cervix visualized.  Cleaned with Betadine x 2.  Grasped anteriorly with a single tooth tenaculum.  Uterus sounded to 8 cm.  IUD placed per manufacturer's recommendations.  Strings trimmed to 3 cm. Tenaculum was removed, good hemostasis noted.  Patient tolerated procedure well.   Patient was given post-procedure instructions.  She was advised to have backup contraception for one week.  Patient was also asked to check IUD strings periodically and follow up in 4 weeks for IUD check.  IUD Removal  Patient identified, informed consent performed, consent signed.  Patient was in the dorsal lithotomy position, normal external genitalia was noted.  A speculum was placed in the patient's vagina, normal discharge was noted, no lesions. The cervix was visualized, no lesions, no abnormal discharge.   Patient tolerated the procedure well.     Patient given depo injection today for heavy bleeding with menses.    Federico Flake, MD  Faculty Practice  Center for [employer], [facility]

## 2018-05-28 ENCOUNTER — Ambulatory Visit (INDEPENDENT_AMBULATORY_CARE_PROVIDER_SITE_OTHER): Payer: Self-pay | Admitting: Physician Assistant

## 2018-05-28 VITALS — BP 90/64 | HR 76 | Temp 98.1°F | Resp 16 | Wt 145.0 lb

## 2018-05-28 DIAGNOSIS — R6889 Other general symptoms and signs: Secondary | ICD-10-CM

## 2018-05-28 DIAGNOSIS — J209 Acute bronchitis, unspecified: Secondary | ICD-10-CM

## 2018-05-28 DIAGNOSIS — J45909 Unspecified asthma, uncomplicated: Secondary | ICD-10-CM

## 2018-05-28 MED ORDER — ALBUTEROL SULFATE (2.5 MG/3ML) 0.083% IN NEBU
2.5000 mg | INHALATION_SOLUTION | Freq: Once | RESPIRATORY_TRACT | Status: AC
  Administered 2018-05-28: 2.5 mg via RESPIRATORY_TRACT

## 2018-05-28 MED ORDER — ALBUTEROL SULFATE HFA 108 (90 BASE) MCG/ACT IN AERS: 2.0000 | INHALATION_SPRAY | RESPIRATORY_TRACT | 0 refills | Status: DC | PRN

## 2018-05-28 MED ORDER — PREDNISONE 50 MG PO TABS: 50.0000 mg | ORAL_TABLET | Freq: Every day | ORAL | 0 refills | Status: AC

## 2018-05-28 NOTE — Patient Instructions (Addendum)
Thank you for choosing [facility] for your health care needs.  You have been diagnosed with bronchitis (a chest cold).  Meds ordered this encounter  Medications  . albuterol (PROVENTIL) (2.5 MG/3ML) 0.083% nebulizer solution 2.5 mg  . predniSONE (DELTASONE) 50 MG tablet    Sig: Take 1 tablet (50 mg total) by mouth daily with breakfast for 5 days.    Dispense:  5 tablet    Refill:  0    Order Specific Question:   Supervising Provider    Answer:   MILLER, BRIAN [[id number]]  . albuterol (PROVENTIL HFA;VENTOLIN HFA) 108 (90 Base) MCG/ACT inhaler    Sig: Inhale 2 puffs into the lungs every 4 (four) hours as needed.    Dispense:  1 Inhaler    Refill:  0    Order Specific Question:   Supervising Provider    Answer:   MILLER, BRIAN [[id number]]   Recommend increase fluids; water, Gatorade, hot tea with lemon/honey, or diluted juice (1/2 juice and 1/2 water). Rest. May use over the counter Tylenol or ibuprofen for fever and/or body aches.  May use over the counter Delsym or Robitussin for cough. May prop self up with several pillows in bed or sleep in a recliner, will help reduce cough. May use cool mist humidifier in bedroom, will help with cough.  Follow-up with family physician or urgent care in 4-5 days if symptoms not improving; sooner with any worsening symptoms.  Hope you feel better soon!  Acute Bronchitis, Adult Acute bronchitis is when air tubes (bronchi) in the lungs suddenly get swollen. The condition can make it hard to breathe. It can also cause these symptoms:  A cough.  Coughing up clear, yellow, or green mucus.  Wheezing.  Chest congestion.  Shortness of breath.  A fever.  Body aches.  Chills.  A sore throat. Follow these instructions at home:  Medicines  Take over-the-counter and prescription medicines only as told by your doctor.  If you were prescribed an antibiotic medicine, take it as told by your doctor. Do not stop taking the antibiotic even if you start  to feel better. General instructions  Rest.  Drink enough fluids to keep your pee (urine) pale yellow.  Avoid smoking and secondhand smoke. If you smoke and you need help quitting, ask your doctor. Quitting will help your lungs heal faster.  Use an inhaler, cool mist vaporizer, or humidifier as told by your doctor.  Keep all follow-up visits as told by your doctor. This is important. How is this prevented? To lower your risk of getting this condition again:  Wash your hands often with soap and water. If you cannot use soap and water, use hand sanitizer.  Avoid contact with people who have cold symptoms.  Try not to touch your hands to your mouth, nose, or eyes.  Make sure to get the flu shot every year. Contact a doctor if:  Your symptoms do not get better in 2 weeks. Get help right away if:  You cough up blood.  You have chest pain.  You have very bad shortness of breath.  You become dehydrated.  You faint (pass out) or keep feeling like you are going to pass out.  You keep throwing up (vomiting).  You have a very bad headache.  Your fever or chills gets worse. This information is not intended to replace advice given to you by your health care provider. Make sure you discuss any questions you have with your health care provider. Document  Released: 09/11/2007 Document Revised: 11/06/2016 Document Reviewed: 09/13/2015 Elsevier Interactive Patient Education  2019 [employer].

## 2018-05-28 NOTE — Progress Notes (Signed)
Patient ID: Emily Santos DOB: June 15, 1995 AGE: 23 y.o. MRN: [id number]   PCP: Emi Belfast, FNP   Chief Complaint:  Chief Complaint  Patient presents with  . Cough    x4d  . Fever    x4d  . Generalized Body Aches    x4d  . Nasal Congestion    x4d     Subjective:    HPI:  Emily Santos is a 23 y.o. female presents for evaluation  Chief Complaint  Patient presents with  . Cough    x4d  . Fever    x4d  . Generalized Body Aches    x4d  . Nasal Congestion    x56d    23 year old female presents to [facility] with one week history of cough. Began on Friday 05/22/18 with chest tightness. Associated dry cough. Lasted 1 day. Seemed to resolve. Following day developed nasal congestion, bilateral maxillary sinus pressure/congestion, postnasal drip, and coarse/junky cough. Associated chest tightness and wheezing. Patient used last of albuterol inhaler (old/expired and no puffs left). Patient felt better the following day. Then yesterday, developed fever (101F), chills, sweats, body aches. Took OTC ibuprofen 800mg  yesterday evening. Has not had return of fever (has not taken an antipyretic since yesterday evening). Continues to have chest congestion with coarse cough. Denies headache, ear pain, current sinus pain, sore throat, chest pain, SOB, abdominal pain, nausea/vomiting, diarrhea.  Patient did receive this season's influenza vaccination. 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  Patient with history of asthma. Has albuterol inhaler. Does not have maintenance/preventative inhaler. Previous history of bronchitis causing wheezing/chest tightness/bronchospasm. Was treated at [facility], [place], in May 2017 with albuterol nebulizer treatment.  A limited review of symptoms was performed, pertinent positives and negatives as mentioned in HPI.  The following portions of the patient's history were  reviewed and updated as appropriate: allergies, current medications and past medical history.  Patient Active Problem List   Diagnosis Date Noted  . Orbital pain, left 01/31/2016  . Headache 01/31/2016  . 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 Sulfa Antibiotics Rash    Current Outpatient Medications on File Prior to Visit  Medication Sig Dispense Refill  . cetirizine (ZYRTEC) 10 MG tablet Take 10 mg by mouth as needed.     . cyclobenzaprine (FLEXERIL) 10 MG tablet Take 1 tablet (10 mg total) by mouth 3 (three) times daily as needed for muscle spasms. 40 tablet 2  . Galcanezumab-gnlm (EMGALITY) 120 MG/ML SOAJ Inject 240 mg into the skin as directed AND 120 mg every 30 (thirty) days. Inj 240mg  once then 120mg  monthly. 1 pen 11  . ketoconazole (NIZORAL) 2 % shampoo Apply 1 application topically daily. For three days. 120 mL 1  . medroxyPROGESTERone (DEPO-PROVERA) 150 MG/ML injection Inject 1 mL (150 mg total) into the muscle every 3 (three) months. 1 mL 4  . metroNIDAZOLE (FLAGYL) 500 MG tablet Take 1 tablet (500 mg total) by mouth 2 (two) times daily. 14 tablet 0  . SUMAtriptan (IMITREX) 100 MG tablet Take 1 tablet (100 mg total) by mouth once as needed for up to 1 dose for migraine. May repeat in 2 hours if headache persists or recurs. 9 tablet 11  . diclofenac (VOLTAREN) 75 MG EC tablet Take 1 tablet (75 mg total) by mouth 2 (two) times daily as needed. 30 tablet 0  No current facility-administered medications on file prior to visit.        Objective:   Vitals:   05/28/18 0816  BP: 90/64  Pulse: 76  Resp: 16  Temp: 98.1 F (36.7 C)  SpO2: 97%     Wt Readings from Last 3 Encounters:  05/28/18 145 lb (65.8 kg)  05/20/18 150 lb (68 kg)  05/06/18 152 lb 3.2 oz (69 kg)    Physical Exam:   General Appearance:  Patient sitting comfortably on examination table. Conversational. Peri JeffersonGood self-historian. In no acute  distress. Afebrile.   Head:  Normocephalic, without obvious abnormality, atraumatic  Eyes:  PERRL, conjunctiva/corneas clear, EOM's intact  Ears:  Bilateral ear canals WNL. No erythema or edema. No discharge/drainage. Bilateral TMs WNL. No erythema, injection, or serous effusion. No scar tissue.  Nose: Nares normal, septum midline. Nasal mucosa with bilateral edema and scant clear rhinorrhea. No sinus tenderness with percussion/palpation.  Throat: Lips, mucosa, and tongue normal; teeth and gums normal. Throat reveals no erythema. Tonsils with no enlargement or exudate.  Neck: Supple, symmetrical, trachea midline, no adenopathy  Lungs:   Lung auscultation reveals diminished expiratory phase. No wheezing with forced expiration. Coarse cough elicited with deep inspiration.  Heart:  Regular rate and rhythm, S1 and S2 normal, no murmur, rub, or gallop  Extremities: Extremities normal, atraumatic, no cyanosis or edema  Pulses: 2+ and symmetric  Skin: Skin color, texture, turgor normal, no rashes or lesions  Lymph nodes: Cervical, supraclavicular, and axillary nodes normal  Neurologic: Normal    Assessment & Plan:    Exam findings, diagnosis etiology and medication use and indications reviewed with patient. Follow-Up and discharge instructions provided. No emergent/urgent issues found on exam.  Patient education was provided.   Patient verbalized understanding of information provided and agrees with plan of care (POC), all questions answered. The patient is advised to call or return to clinic if condition does not see an improvement in symptoms, or to seek the care of the closest emergency department if condition worsens with the below plan.    Patient received albuterol nebulizer treatment. Status post albuterol nebulizer treatment: improved aeration, patient able to take deep inspiration without triggering cough, clear lung sounds, no wheezing, increase in oxygen saturation from 97% to 98%,  patient reports subjective symptom improvement.   1. Bronchitis with asthma, acute - predniSONE (DELTASONE) 50 MG tablet; Take 1 tablet (50 mg total) by mouth daily with breakfast for 5 days.  Dispense: 5 tablet; Refill: 0 - albuterol (PROVENTIL HFA;VENTOLIN HFA) 108 (90 Base) MCG/ACT inhaler; Inhale 2 puffs into the lungs every 4 (four) hours as needed.  Dispense: 1 Inhaler; Refill: 0  2. Flu-like symptoms - albuterol (PROVENTIL) (2.5 MG/3ML) 0.083% nebulizer solution 2.5 mg   Patient with one week history of cough. On and off. Associated chest tightness and wheezing. Onset fever and constitutional symptoms yesterday (malaise and body aches) - have since resolved. Patient declined influenza test; feels her symptoms are beyond the 48 hour window for antiviral. Patient with underlying history of asthma. Significant improvement with albuterol nebulizer treatment. Prescribed albuterol inhaler and prednisone blast. VSS, afebrile (last antipyretic yesterday evening), in no acute distress, clear lung sounds s/p neb treatment, 98% pulse ox. Discussed possibility of CAP; low likelihood at this time with normal vital signs and clear lung sounds. Advised patient follow-up with PCP or urgent care in 4-5 days if symptoms not improving, sooner with any worsening symptoms, for further evaluation and treatment. Patient agreed with plan.  Janalyn Harder, MHS, PA-C Rulon Sera, MHS, PA-C Advanced Practice Provider [facility]  [address] [facility], [address]st [address] [place], [place] [address] (p):  [phone] [email or web address]

## 2018-06-03 ENCOUNTER — Telehealth: Payer: Self-pay | Admitting: Emergency Medicine

## 2018-06-03 NOTE — Telephone Encounter (Signed)
Spoke to patient whom informed me that she is doing better still a little chest congestion but better. This was a follow call from visit with [facility].

## 2018-06-23 ENCOUNTER — Ambulatory Visit (INDEPENDENT_AMBULATORY_CARE_PROVIDER_SITE_OTHER): Payer: No Typology Code available for payment source | Admitting: Family Medicine

## 2018-06-23 ENCOUNTER — Other Ambulatory Visit: Payer: Self-pay

## 2018-06-23 ENCOUNTER — Encounter: Payer: Self-pay | Admitting: Family Medicine

## 2018-06-23 VITALS — BP 90/58 | HR 78 | Temp 98.8°F | Ht 66.0 in | Wt 145.6 lb

## 2018-06-23 DIAGNOSIS — J209 Acute bronchitis, unspecified: Secondary | ICD-10-CM

## 2018-06-23 DIAGNOSIS — J069 Acute upper respiratory infection, unspecified: Secondary | ICD-10-CM | POA: Diagnosis not present

## 2018-06-23 DIAGNOSIS — J45909 Unspecified asthma, uncomplicated: Secondary | ICD-10-CM

## 2018-06-23 MED ORDER — PREDNISONE 10 MG PO TABS: ORAL_TABLET | ORAL | 0 refills | Status: DC

## 2018-06-23 MED ORDER — FLUTICASONE PROPIONATE 50 MCG/ACT NA SUSP: 2.0000 | Freq: Every day | NASAL | Status: DC

## 2018-06-23 MED ORDER — ALBUTEROL SULFATE HFA 108 (90 BASE) MCG/ACT IN AERS: 2.0000 | INHALATION_SPRAY | RESPIRATORY_TRACT | 0 refills | Status: DC | PRN

## 2018-06-23 MED ORDER — AMOXICILLIN-POT CLAVULANATE 875-125 MG PO TABS: 1.0000 | ORAL_TABLET | Freq: Two times a day (BID) | ORAL | 0 refills | Status: DC

## 2018-06-23 NOTE — Assessment & Plan Note (Signed)
Continue flonase.  Start prednisone with food if needed.  If not better, then start augmentin.  Rest and fluids.  Use albuterol if needed.  Nontoxic.  She agrees with plan.

## 2018-06-23 NOTE — Progress Notes (Signed)
H/o low BP at baseline. Needed refill on SABA, lost old inhaler.   duration of symptoms: about 3 days ago.  Rhinorrhea: yes Congestion: yes ear pain: no sore throat: initially in the AM from post nasal gtt, but better as the day goes on Cough:minimal, at night, from post nasal gtt.  Myalgias:no No fevers, no diarrhea, no vomiting.  No travel.    Some wheeze in the AM and usually in the springtime of the year.  H/o relief with SABA.    H/o prednisone use but no h/o hospitalization from asthma.    She has h/o allergies and asthma at baseline.    Per HPI unless specifically indicated in ROS section  Meds, vitals, and allergies reviewed.   GEN: nad, alert and oriented HEENT: mucous membranes moist, TM w/o erythema, nasal epithelium injected, OP with cobblestoning NECK: supple w/o LA CV: rrr. PULM: ctab, no inc wob EXT: no edema.  Max sinuses ttp.

## 2018-06-23 NOTE — Patient Instructions (Signed)
Continue flonase.  Start prednisone with food if needed.  If not better, then start augmentin.  Rest and fluids.  Use albuterol if needed.  Take care.  Glad to see you.

## 2018-07-24 ENCOUNTER — Ambulatory Visit (INDEPENDENT_AMBULATORY_CARE_PROVIDER_SITE_OTHER): Payer: No Typology Code available for payment source | Admitting: Physician Assistant

## 2018-07-24 ENCOUNTER — Other Ambulatory Visit: Payer: Self-pay

## 2018-07-24 ENCOUNTER — Encounter: Payer: Self-pay | Admitting: Physician Assistant

## 2018-07-24 DIAGNOSIS — G43009 Migraine without aura, not intractable, without status migrainosus: Secondary | ICD-10-CM | POA: Insufficient documentation

## 2018-07-24 DIAGNOSIS — G4440 Drug-induced headache, not elsewhere classified, not intractable: Secondary | ICD-10-CM | POA: Diagnosis not present

## 2018-07-24 NOTE — Progress Notes (Signed)
   TELEHEALTH VIRTUAL HEADACHE VISIT ENCOUNTER NOTE  I connected with [name] on 07/24/18 at 10:15 AM EDT by WebEx at home and verified that I am speaking with the correct person using two identifiers.   I discussed the limitations, risks, security and privacy concerns of performing an evaluation and management service by telephone and the availability of in person appointments. I also discussed with the patient that there may be a patient responsible charge related to this service. The patient expressed understanding and agreed to proceed.   History:  Emily Santos is a 23 y.o. G4P1001 female being evaluated today for headaches.  She has seen some reduction in the overall number of headaches with the use of Emgality.  She should be starting her 3rd month of use soon as she is due to take the next injection.  She does feel like it is not lasting the full month at this time.  She has used [employer] and notes it works well without bothersome side effects.  She is using Flexeril for the next pain/muscle spasm which sometimes persists beyond the head pain.       Past Medical History:  Diagnosis Date  . Headache    Past Surgical History:  Procedure Laterality Date  . WISDOM TOOTH EXTRACTION     The following portions of the patient's history were reviewed and updated as appropriate: allergies, current medications, past family history, past medical history, past social history, past surgical history and problem list.     Review of Systems:  Pertinent items noted in HPI and remainder of comprehensive ROS otherwise negative.  Physical Exam:   General:  Alert, oriented and cooperative.   Mental Status: Normal mood and affect perceived. Normal judgment and thought content.  Physical exam deferred due to nature of the encounter  Labs and Imaging No results found for this or any previous visit (from the past 336 hour(s)). No results found.    Assessment and Plan:     1. Medication  overuse headache   2. Migraine without aura, not intractable, without status migrainosus    Continue Emgality - pt to get sample from office in addition to the rx that is available already at the pharmacy.     I discussed the assessment and treatment plan with the patient. The patient was provided an opportunity to ask questions and all were answered. The patient agreed with the plan and demonstrated an understanding of the instructions.   The patient was advised to call back or seek an in-person evaluation/go to the ED if the symptoms worsen or if the condition fails to improve as anticipated.  I provided 15 minutes of face-to-face time during this encounter.   Bertram Denver, PA-C Center for [employer], [facility]

## 2018-07-24 NOTE — Patient Instructions (Signed)

## 2018-09-02 ENCOUNTER — Other Ambulatory Visit: Payer: Self-pay

## 2018-09-02 ENCOUNTER — Ambulatory Visit (INDEPENDENT_AMBULATORY_CARE_PROVIDER_SITE_OTHER): Payer: No Typology Code available for payment source

## 2018-09-02 VITALS — BP 103/63 | HR 77 | Wt 137.6 lb

## 2018-09-02 DIAGNOSIS — Z3042 Encounter for surveillance of injectable contraceptive: Secondary | ICD-10-CM | POA: Diagnosis not present

## 2018-09-02 MED ORDER — MEDROXYPROGESTERONE ACETATE 150 MG/ML IM SUSP
150.0000 mg | Freq: Once | INTRAMUSCULAR | Status: AC
  Administered 2018-09-02: 150 mg via INTRAMUSCULAR

## 2018-09-02 NOTE — Progress Notes (Signed)
Emily Santos here for Depo-Provera  Injection.  Injection administered without complication. Patient will return in 3 months for next injection. Pt called to see if she can come in to get another Depo injection because of bleeding.Per Dr. Alvester Morin, pt can have depo.    chiquita l wilson, CMA 09/02/2018  1:09 PM

## 2018-09-02 NOTE — Progress Notes (Signed)
Attestation of Attending Supervision of clinical support staff: I agree with the care provided to this patient and was available for any consultation.  I have reviewed the CMA's note and chart, and I agree with the management and plan.  Abisai Coble Niles Karra Pink, MD, MPH, ABFM Attending Physician Faculty Practice- [facility]  

## 2018-11-18 ENCOUNTER — Other Ambulatory Visit: Payer: Self-pay

## 2018-11-18 ENCOUNTER — Ambulatory Visit (INDEPENDENT_AMBULATORY_CARE_PROVIDER_SITE_OTHER): Payer: No Typology Code available for payment source

## 2018-11-18 VITALS — BP 107/71 | HR 72

## 2018-11-18 DIAGNOSIS — Z3042 Encounter for surveillance of injectable contraceptive: Secondary | ICD-10-CM

## 2018-11-18 MED ORDER — MEDROXYPROGESTERONE ACETATE 150 MG/ML IM SUSP
150.0000 mg | Freq: Once | INTRAMUSCULAR | Status: AC
  Administered 2018-11-18: 150 mg via INTRAMUSCULAR

## 2018-11-18 NOTE — Progress Notes (Signed)
Patient presented to the office today for her depo-provera injection NDC# [phone] given in left arm IM.   HCG Serum: Not indicted at this time  Side effects: none at this time Follow up: Three month

## 2018-11-18 NOTE — Progress Notes (Signed)
Patient seen and assessed by nursing staff.  Agree with documentation and plan.  

## 2018-12-15 ENCOUNTER — Other Ambulatory Visit: Payer: Self-pay | Admitting: Obstetrics & Gynecology

## 2018-12-15 DIAGNOSIS — F322 Major depressive disorder, single episode, severe without psychotic features: Secondary | ICD-10-CM

## 2018-12-15 NOTE — BH Specialist Note (Addendum)
Integrated Behavioral Health via Telemedicine Video Visit  12/15/2018 Emily Santos [id number]  Number of Integrated Behavioral Health visits: 1 Session Start time: 3:23  Session End time: 3:59 Total time: 30 minutes  Referring Provider: Jaynie CollinsUgonna Anyanwu, MD Type of Visit: Telephone Patient/Family location: Home  [facility] Provider location: [facility] All persons participating in visit: Patient Emily BeckersKatelynn Santos and [facility] Hulda MarinJamie Javyn Havlin and MSW Intern Donita Brooksiana Roberts  Confirmed patient's address: Yes  Confirmed patient's phone number: Yes  Any changes to demographics: No   Confirmed patient's insurance: Yes  Any changes to patient's insurance: No   Discussed confidentiality: Yes   I connected with Emily Santos by a telephone enabled telemedicine application and verified that I am speaking with the correct person using two identifiers.     I discussed the limitations of evaluation and management by telemedicine and the availability of in person appointments.  I discussed that the purpose of this visit is to provide behavioral health care while limiting exposure to the novel coronavirus.   Discussed there is a possibility of technology failure and discussed alternative modes of communication if that failure occurs.  I discussed that engaging in this video visit, they consent to the provision of behavioral healthcare and the services will be billed under their insurance.  Patient and/or legal guardian expressed understanding and consented to telephone visit: Yes   PRESENTING CONCERNS: Patient and/or family reports the following symptoms/concerns: Pt states that her primary concern today is that she notices she needs to talk about how she is feeling today, has a family history of depression, and is open to both medication and self-coping strategies to treat her symptoms of depression and anxiety. Pt has had "thoughts of death and dying" with no intent and no plan.  Duration of problem: Ongoing ;  Severity of problem: severe  STRENGTHS (Protective Factors/Coping Skills): Self-aware  GOALS ADDRESSED: Patient will: 1.  Reduce symptoms of: anxiety, depression and stress  2.  Increase knowledge and/or ability of: coping skills  3.  Demonstrate ability to: Increase healthy adjustment to current life circumstances and Increase motivation to adhere to plan of care  INTERVENTIONS: Interventions utilized:  Behavioral Activation and Psychoeducation and/or Health Education Standardized Assessments completed: C-SSRS Short, GAD-7 and PHQ 9  ASSESSMENT: Patient currently experiencing Major depressive disorder, severe, without psychotic features.   Patient may benefit from psychoeducation and brief therapeutic interventions regarding coping with symptoms of depression and anxiety. Marland Kitchen.  PLAN: 1. Follow up with behavioral health clinician on : One week; will discuss options for psychiatry and ongoing therapy at this time.  2. Behavioral recommendations:  -Begin taking BH medication as prescribed -Follow Safety Plan, as discussed in visit today, if SI begins -Go outside daily with son for at least 20 minutes on nice days, and at least 1 minute on rainy days.  3. Referral(s): Integrated [facility] ([facility] Clinic)  I discussed the assessment and treatment plan with the patient and/or parent/guardian. They were provided an opportunity to ask questions and all were answered. They agreed with the plan and demonstrated an understanding of the instructions.   They were advised to call back or seek an in-person evaluation if the symptoms worsen or if the condition fails to improve as anticipated.  Valetta CloseJamie C [facility]  Depression screen [facility] 2/[facility] 12/16/2018 03/24/2017 08/26/2015 06/29/2015 05/02/2015  Decreased Interest 3 0 0 0 0  Down, Depressed, Hopeless 2 0 0 0 0  PHQ - 2 Score 5 0 0 0 0  Altered  sleeping 3 - - - -  Tired, decreased energy 2 - - - -  Change in appetite 3 - - - -  Feeling bad  or failure about yourself  3 - - - -  Trouble concentrating 3 - - - -  Moving slowly or fidgety/restless 1 - - - -  Suicidal thoughts 1 - - - -  PHQ-9 Score 21 - - - -   GAD 7 : Generalized Anxiety Score 12/16/2018  Nervous, Anxious, on Edge 3  Control/stop worrying 3  Worry too much - different things 3  Trouble relaxing 3  Restless 2  Easily annoyed or irritable 3  Afraid - awful might happen 2  Total GAD 7 Score 19

## 2018-12-16 ENCOUNTER — Telehealth (INDEPENDENT_AMBULATORY_CARE_PROVIDER_SITE_OTHER): Payer: No Typology Code available for payment source | Admitting: Clinical

## 2018-12-16 ENCOUNTER — Other Ambulatory Visit: Payer: Self-pay

## 2018-12-16 ENCOUNTER — Other Ambulatory Visit: Payer: Self-pay | Admitting: Obstetrics & Gynecology

## 2018-12-16 DIAGNOSIS — F329 Major depressive disorder, single episode, unspecified: Secondary | ICD-10-CM | POA: Insufficient documentation

## 2018-12-16 DIAGNOSIS — F322 Major depressive disorder, single episode, severe without psychotic features: Secondary | ICD-10-CM

## 2018-12-16 DIAGNOSIS — F32A Depression, unspecified: Secondary | ICD-10-CM | POA: Insufficient documentation

## 2018-12-16 DIAGNOSIS — F419 Anxiety disorder, unspecified: Secondary | ICD-10-CM

## 2018-12-16 MED ORDER — SERTRALINE HCL 50 MG PO TABS: 50.0000 mg | ORAL_TABLET | Freq: Every day | ORAL | 3 refills | Status: DC

## 2018-12-16 NOTE — Progress Notes (Signed)
    Zoloft 50 mg po qhs prescribed for patient as per [facility] Counselor recommendations, for treatment of her depression and anxiety.  Patient informed and advised to pick up prescription and take as directed.  She will follow up with PCP or mental health provider for further management.   Verita Schneiders, MD, [place] for [employer], [place]

## 2018-12-18 ENCOUNTER — Telehealth: Payer: Self-pay | Admitting: Clinical

## 2018-12-18 NOTE — Telephone Encounter (Signed)
Integrated Behavioral Health Medication Management Phone Note  MRN: [id number] NAME: Emily Santos  Time Call Initiated: 11:50 Time Call Completed: 11:52 Total Call Time: 15 minutes  Current Medications:  Outpatient Medications Prior to Visit  Medication Sig Dispense Refill  . albuterol (PROVENTIL HFA;VENTOLIN HFA) 108 (90 Base) MCG/ACT inhaler Inhale 2 puffs into the lungs every 4 (four) hours as needed. Okay to fill with albuterol/ventolin/proair 1 Inhaler 0  . cetirizine (ZYRTEC) 10 MG tablet Take 10 mg by mouth as needed.     . sertraline (ZOLOFT) 50 MG tablet Take 1 tablet (50 mg total) by mouth at bedtime. 30 tablet 3  . SUMAtriptan (IMITREX) 100 MG tablet Take 1 tablet (100 mg total) by mouth once as needed for up to 1 dose for migraine. May repeat in 2 hours if headache persists or recurs. 9 tablet 11   No facility-administered medications prior to visit.     Patient has been able to get all medications filled as prescribed: Yes  Patient is currently taking all medications as prescribed: Yes  Patient reports experiencing side effects: Yes- nausea  Patient describes feeling this way on medications: "A little sleepy"  Additional patient concerns: none at this time; pt does want [name] to be aware that Dr. Ernestina Patches is managing her medications now.   Patient advised to schedule appointment with provider for evaluation of medication side effects or additional concerns: No Pt has appointment [name].    Caroleen Hamman McMannes, LCSW

## 2018-12-23 ENCOUNTER — Telehealth (INDEPENDENT_AMBULATORY_CARE_PROVIDER_SITE_OTHER): Payer: No Typology Code available for payment source | Admitting: Clinical

## 2018-12-23 ENCOUNTER — Other Ambulatory Visit: Payer: Self-pay

## 2018-12-23 DIAGNOSIS — F322 Major depressive disorder, single episode, severe without psychotic features: Secondary | ICD-10-CM | POA: Diagnosis not present

## 2018-12-23 NOTE — BH Specialist Note (Signed)
Integrated Behavioral Health via Telemedicine Video Visit  12/23/2018 Emily Santos [id number]  Number of [place] visits: 2 Session Start time: 3:20  Session End time: 3:41 Total time: 20 minutes  Referring Provider: Verita Schneiders, MD Type of Visit: Video Patient/Family location: WORK (break) [facility] Provider location: [facility] All persons participating in visit: Patient Emily Santos and [place]   Confirmed patient's address: Yes  Confirmed patient's phone number: Yes  Any changes to demographics: No   Confirmed patient's insurance: Yes  Any changes to patient's insurance: No   Discussed confidentiality: At previous visit  I connected with [name] by a video enabled telemedicine application and verified that I am speaking with the correct person using two identifiers.     I discussed the limitations of evaluation and management by telemedicine and the availability of in person appointments.  I discussed that the purpose of this visit is to provide behavioral health care while limiting exposure to the novel coronavirus.   Discussed there is a possibility of technology failure and discussed alternative modes of communication if that failure occurs.  I discussed that engaging in this video visit, they consent to the provision of behavioral healthcare and the services will be billed under their insurance.  Patient and/or legal guardian expressed understanding and consented to video visit: Yes   PRESENTING CONCERNS: Patient and/or family reports the following symptoms/concerns: Pt states she is taking Zoloft and no longer feeling nausea as she felt the first few days, is feeling a little bit better, is going outside sometimes, and has started writing in her journal again. Pt denies current SI, no intent, no plan. Pt declines referral to psychiatry, prefers to go through medical provider for refills. Duration of problem: Ongoing; Severity of  problem: severe  STRENGTHS (Protective Factors/Coping Skills): Self-awareness  GOALS ADDRESSED: Patient will: 1.  Reduce symptoms of: anxiety, depression and stress  2.  Increase knowledge and/or ability of: coping skills  3.  Demonstrate ability to: Increase healthy adjustment to current life circumstances and Increase motivation to adhere to plan of care  INTERVENTIONS: Interventions utilized:  Medication Monitoring Standardized Assessments completed: GAD-7 and PHQ 9  ASSESSMENT: Patient currently experiencing Major depressive disorder, severe, without psychotic features.   Patient may benefit from continued brief therapeutic interventions regarding coping with symptoms of anxiety and depression  PLAN: 1. Follow up with behavioral health clinician on : Two weeks 2. Behavioral recommendations:  -Continue taking BH medication as prescribed -Continue following Safety plan, as discussed, if SI returns -Continue going outside with son on non-rainy days -Continue writing in journal daily (at least one sentence) 3. Referral(s): [place] (In Clinic)  I discussed the assessment and treatment plan with the patient and/or parent/guardian. They were provided an opportunity to ask questions and all were answered. They agreed with the plan and demonstrated an understanding of the instructions.   They were advised to call back or seek an in-person evaluation if the symptoms worsen or if the condition fails to improve as anticipated.  Caroleen Hamman [facility]     Depression screen [facility] 2/9 12/23/2018 12/16/2018 03/24/2017 08/26/2015 06/29/2015  Decreased Interest 2 3 0 0 0  Down, Depressed, Hopeless 1 2 0 0 0  PHQ - 2 Score 3 5 0 0 0  Altered sleeping 3 3 - - -  Tired, decreased energy 2 2 - - -  Change in appetite 3 3 - - -  Feeling bad or failure about yourself  1 3 - - -  Trouble concentrating 3 3 - - -  Moving slowly or fidgety/restless 1 1 - - -  Suicidal  thoughts 1 1 - - -  PHQ-9 Score 17 21 - - -   GAD 7 : Generalized Anxiety Score 12/23/2018 12/16/2018  Nervous, Anxious, on Edge 3 3  Control/stop worrying 3 3  Worry too much - different things 3 3  Trouble relaxing 1 3  Restless 3 2  Easily annoyed or irritable 3 3  Afraid - awful might happen 1 2  Total GAD 7 Score 17 19

## 2019-01-06 ENCOUNTER — Other Ambulatory Visit: Payer: Self-pay

## 2019-01-06 ENCOUNTER — Ambulatory Visit: Payer: No Typology Code available for payment source | Admitting: Clinical

## 2019-01-06 DIAGNOSIS — Z91199 Patient's noncompliance with other medical treatment and regimen due to unspecified reason: Secondary | ICD-10-CM

## 2019-01-06 DIAGNOSIS — Z5329 Procedure and treatment not carried out because of patient's decision for other reasons: Secondary | ICD-10-CM

## 2019-01-06 NOTE — BH Specialist Note (Signed)
Pt did not arrive to video visit and did not answer phone; phone went to busy signal, so unable to leave phone message; left MyChart message for patient.  [place] via Telemedicine Video Visit  01/06/2019 Emily Santos [id number]   [name]

## 2019-01-07 ENCOUNTER — Other Ambulatory Visit: Payer: Self-pay | Admitting: Family Medicine

## 2019-01-07 DIAGNOSIS — F419 Anxiety disorder, unspecified: Secondary | ICD-10-CM

## 2019-01-07 DIAGNOSIS — F32A Depression, unspecified: Secondary | ICD-10-CM

## 2019-01-07 DIAGNOSIS — F329 Major depressive disorder, single episode, unspecified: Secondary | ICD-10-CM

## 2019-01-07 MED ORDER — SERTRALINE HCL 50 MG PO TABS: 50.0000 mg | ORAL_TABLET | Freq: Every day | ORAL | 11 refills | Status: DC

## 2019-01-07 NOTE — Progress Notes (Signed)
Refilled Rx for 1 year

## 2019-01-26 ENCOUNTER — Other Ambulatory Visit: Payer: Self-pay | Admitting: *Deleted

## 2019-01-26 ENCOUNTER — Ambulatory Visit (INDEPENDENT_AMBULATORY_CARE_PROVIDER_SITE_OTHER): Payer: No Typology Code available for payment source | Admitting: Family Medicine

## 2019-01-26 DIAGNOSIS — R062 Wheezing: Secondary | ICD-10-CM

## 2019-01-26 DIAGNOSIS — J45909 Unspecified asthma, uncomplicated: Secondary | ICD-10-CM

## 2019-01-26 DIAGNOSIS — J209 Acute bronchitis, unspecified: Secondary | ICD-10-CM

## 2019-01-26 DIAGNOSIS — Z20822 Contact with and (suspected) exposure to covid-19: Secondary | ICD-10-CM

## 2019-01-26 MED ORDER — ALBUTEROL SULFATE HFA 108 (90 BASE) MCG/ACT IN AERS: 2.0000 | INHALATION_SPRAY | RESPIRATORY_TRACT | 1 refills | Status: DC | PRN

## 2019-01-26 NOTE — Progress Notes (Signed)
Interactive audio and video telecommunications were attempted between this provider and patient, however failed, due to patient having technical difficulties OR patient did not have access to video capability.  We continued and completed visit with audio only.   Virtual Visit via Telephone Note  I connected with patient on 01/26/19  at 11:49 AM  by telephone and verified that I am speaking with the correct person using two identifiers.  Location of patient: home  Location of MD: [place] Name of referring provider (if blank then none associated): Names per persons and role in encounter:  MD: Earlyne Iba, Patient: name listed above.    I discussed the limitations, risks, security and privacy concerns of performing an evaluation and management service by telephone and the availability of in person appointments. I also discussed with the patient that there may be a patient responsible charge related to this service. The patient expressed understanding and agreed to proceed.  CC: URI.    History of Present Illness: Congestion started yesterday, more last night.  No ST, no cough.  No fevers.  Some wheeze.  She needs a refill on SABA, last dose used last night with some relief.  She has a h/o seasonal sx like this.  Not SOB.  No sputum.    She works at [employer].   Her son is 67 y/o and well.     Observations/Objective: nad Speech wnl  Assessment and Plan:  Wheeze and congestion.  Okay for outpatient f/u.  Could be from seasonal change/allergies, but reasonable to get COVID testing done, rationale d/w pt.  covid testing site/instructions d/w pt.  Out of work in the meantime, until sx resolve and covid test resulted. She can print work note at home, d/w pt.  She agrees.  She can use SABA prn.  Update me as needed.    Follow Up Instructions: see above.    I discussed the assessment and treatment plan with the patient. The patient was provided an opportunity to ask questions and  all were answered. The patient agreed with the plan and demonstrated an understanding of the instructions.   The patient was advised to call back or seek an in-person evaluation if the symptoms worsen or if the condition fails to improve as anticipated.  I provided 15 minutes of non-face-to-face time during this encounter.  Elsie Stain, MD

## 2019-01-27 ENCOUNTER — Telehealth: Payer: Self-pay

## 2019-01-27 NOTE — Telephone Encounter (Signed)
Pt had virtual visit on 01/26/19 and had covid testing also. Pt is out of work until gets covid results and request cb when gets covid results back.FYI to Dr Damita Dunnings

## 2019-01-27 NOTE — Telephone Encounter (Signed)
I do not see her results yet.  Please check on this tomorrow.  Thanks.

## 2019-01-28 DIAGNOSIS — R062 Wheezing: Secondary | ICD-10-CM | POA: Insufficient documentation

## 2019-01-28 LAB — NOVEL CORONAVIRUS, NAA: SARS-CoV-2, NAA: NOT DETECTED

## 2019-01-28 NOTE — Assessment & Plan Note (Signed)
Wheeze and congestion.  Okay for outpatient f/u.  Could be from seasonal change/allergies, but reasonable to get COVID testing done, rationale d/w pt.  covid testing site/instructions d/w pt.  Out of work in the meantime, until sx resolve and covid test resulted. She can print work note at home, d/w pt.  She agrees.  She can use SABA prn.  Update me as needed.

## 2019-01-28 NOTE — Telephone Encounter (Signed)
Results reported to patient. 

## 2019-02-03 ENCOUNTER — Ambulatory Visit: Payer: No Typology Code available for payment source

## 2019-02-08 ENCOUNTER — Other Ambulatory Visit: Payer: Self-pay

## 2019-02-08 DIAGNOSIS — Z20822 Contact with and (suspected) exposure to covid-19: Secondary | ICD-10-CM

## 2019-02-09 ENCOUNTER — Ambulatory Visit: Payer: No Typology Code available for payment source

## 2019-02-09 LAB — NOVEL CORONAVIRUS, NAA: SARS-CoV-2, NAA: NOT DETECTED

## 2019-02-11 ENCOUNTER — Ambulatory Visit (INDEPENDENT_AMBULATORY_CARE_PROVIDER_SITE_OTHER): Payer: No Typology Code available for payment source

## 2019-02-11 ENCOUNTER — Other Ambulatory Visit: Payer: Self-pay

## 2019-02-11 VITALS — BP 116/70 | HR 70

## 2019-02-11 DIAGNOSIS — Z3042 Encounter for surveillance of injectable contraceptive: Secondary | ICD-10-CM | POA: Diagnosis not present

## 2019-02-11 DIAGNOSIS — N920 Excessive and frequent menstruation with regular cycle: Secondary | ICD-10-CM

## 2019-02-11 MED ORDER — MEDROXYPROGESTERONE ACETATE 150 MG/ML IM SUSP: 150.0000 mg | INTRAMUSCULAR | 0 refills | Status: DC

## 2019-02-11 MED ORDER — MEDROXYPROGESTERONE ACETATE 150 MG/ML IM SUSP
150.0000 mg | Freq: Once | INTRAMUSCULAR | Status: AC
  Administered 2019-02-11: 16:00:00 150 mg via INTRAMUSCULAR

## 2019-02-11 NOTE — Progress Notes (Signed)
Date last pap: 05/06/2018 Last Depo-Provera: 11/18/2018 Side Effects if any: None Serum HCG indicated? N/A Depo-Provera 150 mg IM given by: Mickey Farber, Student MA Next appointment due: 04/2019-05/13/2019

## 2019-02-15 ENCOUNTER — Ambulatory Visit: Payer: No Typology Code available for payment source

## 2019-03-24 ENCOUNTER — Telehealth (INDEPENDENT_AMBULATORY_CARE_PROVIDER_SITE_OTHER): Payer: No Typology Code available for payment source | Admitting: Family Medicine

## 2019-03-24 ENCOUNTER — Other Ambulatory Visit: Payer: Self-pay

## 2019-03-24 DIAGNOSIS — F329 Major depressive disorder, single episode, unspecified: Secondary | ICD-10-CM

## 2019-03-24 DIAGNOSIS — F419 Anxiety disorder, unspecified: Secondary | ICD-10-CM

## 2019-03-24 DIAGNOSIS — F32A Depression, unspecified: Secondary | ICD-10-CM

## 2019-03-24 MED ORDER — SERTRALINE HCL 100 MG PO TABS: 100.0000 mg | ORAL_TABLET | Freq: Every day | ORAL | 11 refills | Status: DC

## 2019-03-24 NOTE — Progress Notes (Signed)
   TELEHEALTH VIRTUAL GYNECOLOGY VISIT ENCOUNTER NOTE  I connected with [name] on 03/24/19 at  4:15 PM EST by telephone at home and verified that I am speaking with the correct person using two identifiers.   I discussed the limitations, risks, security and privacy concerns of performing an evaluation and management service by telephone and the availability of in person appointments. I also discussed with the patient that there may be a patient responsible charge related to this service. The patient expressed understanding and agreed to proceed.   History:  KIMBLEY SPRAGUE is a 23 y.o. G34P1001 female being evaluated today for depression- reports no SI/HI. Reports fluctuating mood and trouble getting out of bed. Trouble with motivation and some anhedonia. . She denies any abnormal vaginal discharge, bleeding, pelvic pain or other concerns.       Past Medical History:  Diagnosis Date  . Headache    Past Surgical History:  Procedure Laterality Date  . WISDOM TOOTH EXTRACTION     The following portions of the patient's history were reviewed and updated as appropriate: allergies, current medications, past family history, past medical history, past social history, past surgical history and problem list.   Review of Systems:  Pertinent items noted in HPI and remainder of comprehensive ROS otherwise negative.  Physical Exam:   General:  Alert, oriented and cooperative.   Mental Status: Normal mood and affect perceived. Normal judgment and thought content.  Physical exam deferred due to nature of the encounter  Labs and Imaging No results found for this or any previous visit (from the past 336 hour(s)). No results found.    Assessment and Plan:     1. Anxiety and depression - Plan to stop depo as this could be contributing to mood sx given onset of sx  after starting medication, next admin was due in Jan so this is good timing - Discussed increasing Zoloft  - sertraline  (ZOLOFT) 100 MG tablet; Take 1 tablet (100 mg total) by mouth at bedtime.  Dispense: 100 tablet; Refill: 11 - Plan for next visit: if mood not greatly improved, discussed possible swtich to other SSRI vs SNRI.  - Had [facility] visit tomorrow and encouraged to engage in counseling through our medication trials.   Future Appointments  Date Time Provider [place]  03/25/2019 10:15 AM [facility]  04/29/2019 10:30 AM [facility] NURSE [facility]        I discussed the assessment and treatment plan with the patient. The patient was provided an opportunity to ask questions and all were answered. The patient agreed with the plan and demonstrated an understanding of the instructions.   The patient was advised to call back or seek an in-person evaluation/go to the ED if the symptoms worsen or if the condition fails to improve as anticipated.  I provided 15 minutes of non-face-to-face time during this encounter.   Caren Macadam, MD Center for [employer] [facility] [place] [facility]

## 2019-03-24 NOTE — Progress Notes (Signed)
I connected with  Emily Santos on 03/24/19 at  4:15 PM EST by telephone and verified that I am speaking with the correct person using two identifiers.   I discussed the limitations, risks, security and privacy concerns of performing an evaluation and management service by telephone and the availability of in person appointments. I also discussed with the patient that there may be a patient responsible charge related to this service. The patient expressed understanding and agreed to proceed.  [name] [name], [name] 03/24/2019  4:28 PM

## 2019-03-24 NOTE — BH Specialist Note (Signed)
Integrated Behavioral Health via Telemedicine Telephone Visit  03/24/2019 Emily Santos [id number]  Number of [place] visits: 3 Session Start time: 10:19 Session End time: 10:40 Total time: 21  Referring Provider: Caren Macadam, MD Type of Visit: Phone Patient/Family location: Home [facility] Provider location: [facility] All persons participating in visit: Patient [name] and [name]    Confirmed patient's address: Yes  Confirmed patient's phone number: Yes  Any changes to demographics: No   Confirmed patient's insurance: Yes  Any changes to patient's insurance: No   Discussed confidentiality: At previous visit  I connected with [name]  by a video enabled telemedicine application and verified that I am speaking with the correct person using two identifiers.     I discussed the limitations of evaluation and management by telemedicine and the availability of in person appointments.  I discussed that the purpose of this visit is to provide behavioral health care while limiting exposure to the novel coronavirus.   Discussed there is a possibility of technology failure and discussed alternative modes of communication if that failure occurs.  I discussed that engaging in this video visit, they consent to the provision of behavioral healthcare and the services will be billed under their insurance.  Patient and/or legal guardian expressed understanding and consented to video visit: Yes   PRESENTING CONCERNS: Patient and/or family reports the following symptoms/concerns: Pt states her symptoms of both depression and anxiety have increased, and may be attributed to depo shot. Pt denies current SI, no intent, no plan.  Duration of problem: Ongoing; Severity of problem: severe  STRENGTHS (Protective Factors/Coping Skills): Self-awareness; open to treatment  GOALS ADDRESSED: Patient will: 1.  Reduce symptoms of: anxiety and depression   2.  Demonstrate ability to: Increase healthy adjustment to current life circumstances  INTERVENTIONS: Interventions utilized:  Medication Monitoring and Psychoeducation and/or Health Education Standardized Assessments completed: GAD-7 and PHQ 9  ASSESSMENT: Patient currently experiencing Major depressive disorder, severe, without psychotic features .   Patient may benefit from continued psychoeducation and brief therapeutic interventions regarding coping with symptoms of depression and anxiety  PLAN: 1. Follow up with behavioral health clinician on : Two weeks; call [name] at [phone] if symptoms increase after increasing Zoloft.  2. Behavioral recommendations:  -Begin taking Zoloft 100mg  as prescribed -Continue with plan to discontinue depo shots  -Continue using self-coping strategies that have been helpful in the past (journalling, spending time outside with son, etc.) -Continue following Safety Plan discussed at first visit -Consider keeping track of any mood changes noticed (on calendar, or in journal) 3. Referral(s): [place] (In Clinic)  I discussed the assessment and treatment plan with the patient and/or parent/guardian. They were provided an opportunity to ask questions and all were answered. They agreed with the plan and demonstrated an understanding of the instructions.   They were advised to call back or seek an in-person evaluation if the symptoms worsen or if the condition fails to improve as anticipated.  Caroleen Hamman [facility]  Depression screen [facility] 2/9 03/25/2019 12/23/2018 12/16/2018 03/24/2017 08/26/2015  Decreased Interest 3 2 3  0 0  Down, Depressed, Hopeless 3 1 2  0 0  PHQ - 2 Score 6 3 5  0 0  Altered sleeping 3 3 3  - -  Tired, decreased energy 3 2 2  - -  Change in appetite 3 3 3  - -  Feeling bad or failure about yourself  2 1 3  - -  Trouble concentrating 2  3 3 - -  Moving slowly or fidgety/restless 0 1 1 - -  Suicidal thoughts 2 1 1   - -  PHQ-9 Score 21 17 21  - -   GAD 7 : Generalized Anxiety Score 03/25/2019 12/23/2018 12/16/2018  Nervous, Anxious, on Edge 3 3 3   Control/stop worrying 3 3 3   Worry too much - different things 2 3 3   Trouble relaxing 2 1 3   Restless 3 3 2   Easily annoyed or irritable 3 3 3   Afraid - awful might happen 1 1 2   Total GAD 7 Score 17 17 19

## 2019-03-25 ENCOUNTER — Ambulatory Visit (INDEPENDENT_AMBULATORY_CARE_PROVIDER_SITE_OTHER): Payer: No Typology Code available for payment source | Admitting: Clinical

## 2019-03-25 DIAGNOSIS — F322 Major depressive disorder, single episode, severe without psychotic features: Secondary | ICD-10-CM | POA: Diagnosis not present

## 2019-03-29 NOTE — BH Specialist Note (Addendum)
Integrated Behavioral Health via Telemedicine Video Visit  Patient and/or legal guardian verbally consented to [facility] services about presenting concerns and psychiatric consultation as appropriate.  03/29/2019 Emily Santos [id number]  Number of Integrated Behavioral Health visits: 4 Session Start time: 8:17  Session End time: 8:40 Total time: 23  Referring Provider: Federico Flake, MD Type of Visit: Video Patient/Family location: Home [facility] Provider location: [facility] All persons participating in visit: Patient Emily Santos and [facility] Kiah Keay    Confirmed patient's address: Yes  Confirmed patient's phone number: Yes  Any changes to demographics: No   Confirmed patient's insurance: Yes  Any changes to patient's insurance: No   Discussed confidentiality: At previous visit  I connected with Zollie Beckers by a video enabled telemedicine application and verified that I am speaking with the correct person using two identifiers.     I discussed the limitations of evaluation and management by telemedicine and the availability of in person appointments.  I discussed that the purpose of this visit is to provide behavioral health care while limiting exposure to the novel coronavirus.   Discussed there is a possibility of technology failure and discussed alternative modes of communication if that failure occurs.  I discussed that engaging in this video visit, they consent to the provision of behavioral healthcare and the services will be billed under their insurance.  Patient and/or legal guardian expressed understanding and consented to video visit: Yes   PRESENTING CONCERNS: Patient and/or family reports the following symptoms/concerns: Pt states primary symptoms are fatigue, sleep difficulty(1-3 hours sleep/night), lack of concentration, restlessness, anxiety, worry, irritability; pt prefers talking to Dr. Alvester Morin before putting in referral to  psychiatry. Pt is taking BH medication as prescribed and open to learning an additional self-coping strategy today.  Duration of problem: Ongoing; Severity of problem: severe  STRENGTHS (Protective Factors/Coping Skills): Self-awareness; open to treatement  GOALS ADDRESSED: Patient will: 1.  Reduce symptoms of: anxiety and depression  2.  Demonstrate ability to: Increase healthy adjustment to current life circumstances  INTERVENTIONS: Interventions utilized:  Brief CBT and Psychoeducation and/or Health Education Standardized Assessments completed: GAD-7 and PHQ 9  ASSESSMENT: Patient currently experiencing Major depressive disorder, severe, without psychotic features .   Patient may benefit from continued psychoeducation and brief therapeutic interventions regarding coping with symptoms of depression and anxiety .  PLAN: 1. Follow up with behavioral health clinician on : Two weeks 2. Behavioral recommendations:  -Continue taking Zoloft 100mg  as prescribed -Continue following Safety Plan -Begin using Worry Time strategy, starting today 3. Referral(s): [facility] (In Clinic)  I discussed the assessment and treatment plan with the patient and/or parent/guardian. They were provided an opportunity to ask questions and all were answered. They agreed with the plan and demonstrated an understanding of the instructions.   They were advised to call back or seek an in-person evaluation if the symptoms worsen or if the condition fails to improve as anticipated.  [employer] [facility]  Depression screen [facility] 2/9 04/12/2019 03/25/2019 12/23/2018 12/16/2018 03/24/2017  Decreased Interest 1 3 2 3  0  Down, Depressed, Hopeless 1 3 1 2  0  PHQ - 2 Score 2 6 3 5  0  Altered sleeping 3 3 3 3  -  Tired, decreased energy 3 3 2 2  -  Change in appetite 1 3 3 3  -  Feeling bad or failure about yourself  1 2 1 3  -  Trouble concentrating 3 2 3 3  -  Moving slowly or  fidgety/restless 3 0 1  1 -  Suicidal thoughts 1 2 1 1  -  PHQ-9 Score 17 21 17 21  -   GAD 7 : Generalized Anxiety Score 04/12/2019 03/25/2019 12/23/2018 12/16/2018  Nervous, Anxious, on Edge 3 3 3 3   Control/stop worrying 3 3 3 3   Worry too much - different things 3 2 3 3   Trouble relaxing 2 2 1 3   Restless 3 3 3 2   Easily annoyed or irritable 3 3 3 3   Afraid - awful might happen 1 1 1 2   Total GAD 7 Score 18 17 17  19

## 2019-04-05 ENCOUNTER — Encounter: Payer: Self-pay | Admitting: Radiology

## 2019-04-12 ENCOUNTER — Ambulatory Visit (INDEPENDENT_AMBULATORY_CARE_PROVIDER_SITE_OTHER): Payer: No Typology Code available for payment source | Admitting: Clinical

## 2019-04-12 ENCOUNTER — Other Ambulatory Visit: Payer: Self-pay

## 2019-04-12 DIAGNOSIS — F322 Major depressive disorder, single episode, severe without psychotic features: Secondary | ICD-10-CM

## 2019-04-19 ENCOUNTER — Other Ambulatory Visit: Payer: Self-pay

## 2019-04-19 ENCOUNTER — Telehealth (INDEPENDENT_AMBULATORY_CARE_PROVIDER_SITE_OTHER): Payer: No Typology Code available for payment source | Admitting: Family Medicine

## 2019-04-19 ENCOUNTER — Encounter: Payer: Self-pay | Admitting: Family Medicine

## 2019-04-19 DIAGNOSIS — F419 Anxiety disorder, unspecified: Secondary | ICD-10-CM | POA: Diagnosis not present

## 2019-04-19 DIAGNOSIS — F329 Major depressive disorder, single episode, unspecified: Secondary | ICD-10-CM | POA: Diagnosis not present

## 2019-04-19 DIAGNOSIS — F32A Depression, unspecified: Secondary | ICD-10-CM

## 2019-04-19 MED ORDER — BUSPIRONE HCL 5 MG PO TABS: 5.0000 mg | ORAL_TABLET | Freq: Two times a day (BID) | ORAL | 3 refills | Status: DC

## 2019-04-19 NOTE — Progress Notes (Signed)
    TELEHEALTH GYNECOLOGY VIRTUAL VIDEO VISIT ENCOUNTER NOTE  Provider location: Center for [facility] at [facility]   I connected with [name] on 04/18/24 at  1:00 PM EST by MyChart Video Encounter at home and verified that I am speaking with the correct person using two identifiers.   I discussed the limitations, risks, security and privacy concerns of performing an evaluation and management service virtually and the availability of in person appointments. I also discussed with the patient that there may be a patient responsible charge related to this service. The patient expressed understanding and agreed to proceed.   History:  Emily Santos is a 24 y.o. G69P1001 female being evaluated today for r  Reports improved appetite, improved depressive sx but continued anxiety sx.   She denies any abnormal vaginal discharge, bleeding, pelvic pain or other concerns.       Past Medical History:  Diagnosis Date  . Headache    Past Surgical History:  Procedure Laterality Date  . WISDOM TOOTH EXTRACTION     The following portions of the patient's history were reviewed and updated as appropriate: allergies, current medications, past family history, past medical history, past social history, past surgical history and problem list.    Review of Systems:  Pertinent items noted in HPI and remainder of comprehensive ROS otherwise negative.  Physical Exam:   General:  Alert, oriented and cooperative. Patient appears to be in no acute distress.  Mental Status: Normal mood and affect. Normal behavior. Normal judgment and thought content.   Respiratory: Normal respiratory effort, no problems with respiration noted  Rest of physical exam deferred due to type of encounter   PHQ9 from 21 to 17 and GAD 17 and 18 from Dec and Jan respectively  Labs and Imaging No results found for this or any previous visit (from the past 336 hour(s)). No results found.     Assessment and  Plan:     1. Anxiety and depression Reduction in PHQ9 is not as expected but we recently increased zoloft to 100mg  on 12/16 (full effect unlikely to be seen yet) Discussed starting adjuvant therapy to help with anxiety sx. Patient is ammenable as we await pysch referral/assessment Discussed length of action for depo and likely to be out of her system in the next 4-6 weeks Reviewed safety and available visits with me while we work to get into pyschiatry Referred today to psychiatry to help me with medication managment - busPIRone (BUSPAR) 5 MG tablet; Take 1 tablet (5 mg total) by mouth 2 (two) times daily.  Dispense: 60 tablet; Refill: 3 - Ambulatory referral to Psychiatry       I discussed the assessment and treatment plan with the patient. The patient was provided an opportunity to ask questions and all were answered. The patient agreed with the plan and demonstrated an understanding of the instructions.   The patient was advised to call back or seek an in-person evaluation/go to the ED if the symptoms worsen or if the condition fails to improve as anticipated.  I provided 15 minutes of face-to-face time during this encounter.  Future Appointments  Date Time Provider Department Center  04/26/2019  8:15 AM [facility] HEALTH CLINICIAN [facility]  05/05/2019 10:00 AM 05/07/2019, MD [facility]    Federico Flake, MD Center for [facility], [facility]

## 2019-04-26 ENCOUNTER — Other Ambulatory Visit: Payer: Self-pay

## 2019-04-26 ENCOUNTER — Ambulatory Visit: Payer: No Typology Code available for payment source | Admitting: Clinical

## 2019-04-26 DIAGNOSIS — F322 Major depressive disorder, single episode, severe without psychotic features: Secondary | ICD-10-CM

## 2019-04-26 NOTE — BH Specialist Note (Signed)
Integrated Behavioral Health via Telemedicine Video Visit  04/26/2019 Emily Santos [id number]  Number of Integrated Behavioral Health visits: 5 Session Start time: 8:20  Session End time: 8:35 Total time: 15  Referring Provider: Federico Flake, MD Type of Visit: Video Patient/Family location: Home [facility] Provider location: [facility] All persons participating in visit: Patient Emily Santos and [facility]   Confirmed patient's address: Yes  Confirmed patient's phone number: Yes  Any changes to demographics: No   Confirmed patient's insurance: Yes  Any changes to patient's insurance: No   Discussed confidentiality: At previous visit  I connected with [name]  by a video enabled telemedicine application and verified that I am speaking with the correct person using two identifiers.     I discussed the limitations of evaluation and management by telemedicine and the availability of in person appointments.  I discussed that the purpose of this visit is to provide behavioral health care while limiting exposure to the novel coronavirus.   Discussed there is a possibility of technology failure and discussed alternative modes of communication if that failure occurs.  I discussed that engaging in this video visit, they consent to the provision of behavioral healthcare and the services will be billed under their insurance.  Patient and/or legal guardian expressed understanding and consented to video visit: Yes   PRESENTING CONCERNS: Patient and/or family reports the following symptoms/concerns: Pt states no negative side effects since starting Buspar, and depressive symptoms decreasing; pt has scheduled initial psychiatry visit for end of February.  Duration of problem: Ongoing; Severity of problem: severe  STRENGTHS (Protective Factors/Coping Skills): Self-awareness; open to treatment  GOALS ADDRESSED: Patient will: 1.  Reduce symptoms of: anxiety  2.  Increase  knowledge and/or ability of: healthy habits  3.  Demonstrate ability to: Increase healthy adjustment to current life circumstances  INTERVENTIONS: Interventions utilized:  Medication Monitoring, Sleep Hygiene and Psychoeducation and/or Health Education Standardized Assessments completed: GAD-7 and PHQ 9  ASSESSMENT: Patient currently experiencing Major depressive disorder, severe, without psychotic features.   Patient may benefit from continued psychoeducation and brief therapeutic interventions regarding coping with symptoms of depression and anxiety .  PLAN: 1. Follow up with behavioral health clinician on : Two weeks 2. Behavioral recommendations:  -Continute taking medication as prescribed -Continue following Safety Plan -Consider using Worry Time strategy daily, at least 5 minutes/day -Consider allowing self a 10-20 minute nap when feeling tired in early evening 3. Referral(s): Integrated [employer] (In Clinic)  I discussed the assessment and treatment plan with the patient and/or parent/guardian. They were provided an opportunity to ask questions and all were answered. They agreed with the plan and demonstrated an understanding of the instructions.   They were advised to call back or seek an in-person evaluation if the symptoms worsen or if the condition fails to improve as anticipated.  Valetta Close [facility]   Depression screen [facility] 2/9 04/26/2019 04/12/2019 03/25/2019 12/23/2018 12/16/2018  Decreased Interest 1 1 3 2 3   Down, Depressed, Hopeless 0 1 3 1 2   PHQ - 2 Score 1 2 6 3 5   Altered sleeping 2 3 3 3 3   Tired, decreased energy 2 3 3 2 2   Change in appetite 1 1 3 3 3   Feeling bad or failure about yourself  1 1 2 1 3   Trouble concentrating - 3 2 3 3   Moving slowly or fidgety/restless 1 3 0 1 1  Suicidal thoughts 0 1 2 1 1   PHQ-9  Score 8 17 21 17 21    GAD 7 : Generalized Anxiety Score 04/26/2019 04/12/2019 03/25/2019 12/23/2018  Nervous, Anxious, on Edge 3 3 3 3    Control/stop worrying 3 3 3 3   Worry too much - different things 3 3 2 3   Trouble relaxing 2 2 2 1   Restless 2 3 3 3   Easily annoyed or irritable 3 3 3 3   Afraid - awful might happen 2 1 1 1   Total GAD 7 Score 18 18 17  17

## 2019-04-29 ENCOUNTER — Ambulatory Visit
Admission: EM | Admit: 2019-04-29 | Discharge: 2019-04-29 | Disposition: A | Discharge status: Home / Self Care | Payer: No Typology Code available for payment source | Attending: Emergency Medicine | Admitting: Emergency Medicine

## 2019-04-29 ENCOUNTER — Other Ambulatory Visit: Payer: Self-pay

## 2019-04-29 ENCOUNTER — Ambulatory Visit: Payer: No Typology Code available for payment source

## 2019-04-29 DIAGNOSIS — J039 Acute tonsillitis, unspecified: Secondary | ICD-10-CM | POA: Diagnosis not present

## 2019-04-29 DIAGNOSIS — J029 Acute pharyngitis, unspecified: Secondary | ICD-10-CM | POA: Diagnosis present

## 2019-04-29 LAB — POCT RAPID STREP A (OFFICE): Rapid Strep A Screen: NEGATIVE

## 2019-04-29 NOTE — ED Provider Notes (Signed)
Emily Santos    CSN: [id number] Arrival date & time: 04/29/19  1145      History   Chief Complaint Chief Complaint  Patient presents with  . Sore Throat    HPI Emily Santos is a 24 y.o. female.   Reports sore throat and post nasal drip since yesterday. Reports enlarged tonsils. Denies cough, headache, fever, chills, rash, n/v/d, shortness of breath, other symptoms. Reports that she has had some congestion this morning. Reports that she has taken Zyrtec with moderate relief. Denies inhaler use. Has not attempted any other methods of treatment at home. Denies sick contacts, and reports that her son is in kindergarten.  The history is provided by the patient.  Sore Throat Pertinent negatives include no chest pain, no abdominal pain and no shortness of breath.    Past Medical History:  Diagnosis Date  . Headache     Patient Active Problem List   Diagnosis Date Noted  . Wheeze 01/28/2019  . Anxiety and depression 12/16/2018  . Migraine without aura, not intractable, without status migrainosus 07/24/2018  . Upper respiratory tract infection 06/23/2018  . Orbital pain, left 01/31/2016  . Medication overuse headache 01/31/2016  . 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 WISDOM TOOTH EXTRACTION      OB History    Gravida  1   Para  1   Term  1   Preterm      AB      Living  1     SAB      TAB      Ectopic      Multiple      Live Births  1            Home Medications    Prior to Admission medications   Medication Sig Start Date End Date Taking? Authorizing Provider  albuterol (VENTOLIN HFA) 108 (90 Base) MCG/ACT inhaler Inhale 2 puffs into the lungs every 4 (four) hours as needed. Okay to fill with albuterol/ventolin/proair 01/26/19  Yes Joaquim Nam, MD  busPIRone (BUSPAR) 5 MG tablet Take 1 tablet (5 mg total) by mouth 2 (two) times daily.  04/19/19  Yes Federico Flake, MD  cetirizine (ZYRTEC) 10 MG tablet Take 10 mg by mouth as needed.    Yes [provider]  sertraline (ZOLOFT) 100 MG tablet Take 1 tablet (100 mg total) by mouth at bedtime. 03/24/19  Yes Federico Flake, MD  SUMAtriptan (IMITREX) 100 MG tablet Take 1 tablet (100 mg total) by mouth once as needed for up to 1 dose for migraine. May repeat in 2 hours if headache persists or recurs. 04/24/18  Yes Teague Edwena Blow, PA-C    Family History Family History  Problem Relation Age of Onset  . Hyperlipidemia Father   . Hypothyroidism Father   . Migraines Father   . Hyperthyroidism Paternal Aunt   . Hypertension Paternal Grandmother     Social History Social History   Tobacco Use  . Smoking status: Never Smoker  . Smokeless tobacco: Never Used  Substance Use Topics  . Alcohol use: Yes    Alcohol/week: 4.0 standard drinks    Types: 4 Cans of beer per week    Comment: social events  . Drug use: No     Allergies   Sulfa antibiotics   Review of Systems Review of Systems  Constitutional: Negative for  chills, fatigue and fever.  HENT: Positive for congestion, postnasal drip and sore throat. Negative for ear pain.   Eyes: Negative for pain and visual disturbance.  Respiratory: Negative for cough and shortness of breath.   Cardiovascular: Negative for chest pain and palpitations.  Gastrointestinal: Negative for abdominal pain and vomiting.  Genitourinary: Negative for dysuria and hematuria.  Musculoskeletal: Negative for arthralgias and back pain.  Skin: Negative for color change and rash.  Neurological: Negative for seizures and syncope.  All other systems reviewed and are negative.    Physical Exam Triage Vital Signs ED Triage Vitals [04/29/19 1154]  Enc Vitals Group     BP      Pulse      Resp      Temp      Temp src      SpO2      Weight 139 lb (63 kg)     Height 5\' 6"  (1.676 m)     Head Circumference      Peak  Flow      Pain Score 0     Pain Loc      Pain Edu?      Excl. in [place]?    No data found.  Updated Vital Signs BP 102/63 (BP Location: Left Arm)   Pulse 97   Temp 98.7 F (37.1 C) (Oral)   Resp 14   Ht 5\' 6"  (1.676 m)   Wt 139 lb (63 kg)   SpO2 98%   BMI 22.44 kg/m       Physical Exam Vitals and nursing note reviewed.  Constitutional:      General: She is not in acute distress.    Appearance: She is well-developed.  HENT:     Head: Normocephalic and atraumatic.     Right Ear: Tympanic membrane normal.     Left Ear: Tympanic membrane normal.     Nose: Congestion present.     Mouth/Throat:     Pharynx: Uvula midline. Pharyngeal swelling and posterior oropharyngeal erythema present.  Eyes:     Conjunctiva/sclera: Conjunctivae normal.  Cardiovascular:     Rate and Rhythm: Normal rate and regular rhythm.     Heart sounds: Normal heart sounds. No murmur.  Pulmonary:     Effort: Pulmonary effort is normal. No respiratory distress.     Breath sounds: Normal breath sounds. No stridor. No wheezing or rhonchi.  Abdominal:     Palpations: Abdomen is soft.     Tenderness: There is no abdominal tenderness.  Musculoskeletal:     Cervical back: Neck supple.  Skin:    General: Skin is warm and dry.  Neurological:     Mental Status: She is alert.  Psychiatric:        Behavior: Behavior normal.      UC Treatments / Results  Labs (all labs ordered are listed, but only abnormal results are displayed) Labs Reviewed  CULTURE, GROUP A STREP [facility])  POCT RAPID STREP A (OFFICE)    EKG   Radiology No results found.  Procedures Procedures (including critical care time)  Medications Ordered in UC Medications - No data to display  Initial Impression / Assessment and Plan / UC Course  I have reviewed the triage vital signs and the nursing notes.  Pertinent labs & imaging results that were available during my care of the patient were reviewed by me and considered in my  medical decision making (see chart for details).  Clinical Course as of Apr 29 1227  Thu Apr 29, 2019  1158 POCT rapid strep A [SM]    Clinical Course User Index [SM] Moshe Cipro, NP    Viral pharyngitis, rapid strep negative, will culture and let patient know when results are back. Instructed to stay hydrated, and to take ibuprofen as needed for pain. Instructed on when to report to emergency room.  Final Clinical Impressions(s) / UC Diagnoses   Final diagnoses:  Viral pharyngitis  Acute tonsillitis, unspecified etiology     Discharge Instructions     Take ibuprofen as needed for throat pain. Drink 8-10 glasses of fluids per day.  Strep test today was negative. We are sending a strep culture to be sure. We will call you with results, and treat as needed.   Follow up with your primary care provider as needed.  Report to the Emergency Room for shortness of breath, high fever, trouble breathing, or other concerns.     ED Prescriptions    None     PDMP not reviewed this encounter.   Moshe Cipro, NP 04/29/19 1235

## 2019-04-29 NOTE — ED Triage Notes (Signed)
Patient states that she has been having nasal congestion and drainage that started last night. Patient complains of sore throat with tonsils swollen.

## 2019-04-29 NOTE — Discharge Instructions (Addendum)
Take ibuprofen as needed for throat pain. Drink 8-10 glasses of fluids per day.  Strep test today was negative. We are sending a strep culture to be sure. We will call you with results, and treat as needed.   Follow up with your primary care provider as needed.  Report to the Emergency Room for shortness of breath, high fever, trouble breathing, or other concerns.

## 2019-05-01 LAB — CULTURE, GROUP A STREP (THRC)

## 2019-05-05 ENCOUNTER — Ambulatory Visit: Payer: No Typology Code available for payment source | Admitting: Family Medicine

## 2019-05-07 NOTE — BH Specialist Note (Signed)
Integrated Behavioral Health via Telemedicine phone Visit  05/07/2019 Emily Santos [id number]  Number of Integrated Behavioral Health visits: 6 Session Start time: 8:22  Session End time: 8:32 Total time: 15  Referring Provider: Federico Flake, MD Type of Visit: phone Patient/Family location: Home [facility] Provider location: [facility] All persons participating in visit: Patient Emily Santos and [facility] Marializ Ferrebee    Confirmed patient's address: Yes  Confirmed patient's phone number: Yes  Any changes to demographics: No   Confirmed patient's insurance: Yes  Any changes to patient's insurance: No   Discussed confidentiality: At previous visit  I connected with [name]  by a video enabled telemedicine application and verified that I am speaking with the correct person using two identifiers.     I discussed the limitations of evaluation and management by telemedicine and the availability of in person appointments.  I discussed that the purpose of this visit is to provide behavioral health care while limiting exposure to the novel coronavirus.   Discussed there is a possibility of technology failure and discussed alternative modes of communication if that failure occurs.  I discussed that engaging in this video visit, they consent to the provision of behavioral healthcare and the services will be billed under their insurance.  Patient and/or legal guardian expressed understanding and consented to video visit: Yes   PRESENTING CONCERNS: Patient and/or family reports the following symptoms/concerns: Pt states symptoms of depression and anxiety remain stable, have not increased in the past two weeks (attributes to being busy with work and school) and intends to attend initial appointment with psychiatry last week of February, after completing school semester in two weeks.  Duration of problem: Ongoing; Severity of problem: Moderately severe  STRENGTHS (Protective  Factors/Coping Skills): Adhering to treatment; self-awareness  GOALS ADDRESSED: Patient will: 1.  Reduce symptoms of: anxiety and insomnia   INTERVENTIONS: Interventions utilized:  Supportive Counseling and Medication Monitoring Standardized Assessments completed: Not Needed  ASSESSMENT: Patient currently experiencing Major depressive disorder, recurrent, moderate.   Patient may benefit from continued brief therapeutic interventions regarding coping with symptoms of depression and anxiety.  PLAN: 1. Follow up with behavioral health clinician on : As needed 2. Behavioral recommendations:  -Continue taking medication as prescribed -Continue following Safety Plan -Continue using self-coping strategies as needed daily -Continue with plan to attend initial psychiatry appointment  3. Referral(s): Integrated [employer] (In Clinic)  I discussed the assessment and treatment plan with the patient and/or parent/guardian. They were provided an opportunity to ask questions and all were answered. They agreed with the plan and demonstrated an understanding of the instructions.   They were advised to call back or seek an in-person evaluation if the symptoms worsen or if the condition fails to improve as anticipated.  Valetta Close Vernal Hritz

## 2019-05-10 ENCOUNTER — Other Ambulatory Visit: Payer: Self-pay

## 2019-05-10 ENCOUNTER — Ambulatory Visit (INDEPENDENT_AMBULATORY_CARE_PROVIDER_SITE_OTHER): Payer: No Typology Code available for payment source | Admitting: Clinical

## 2019-05-10 DIAGNOSIS — F331 Major depressive disorder, recurrent, moderate: Secondary | ICD-10-CM | POA: Diagnosis not present

## 2019-05-24 ENCOUNTER — Ambulatory Visit: Payer: No Typology Code available for payment source

## 2019-05-24 ENCOUNTER — Ambulatory Visit: Payer: No Typology Code available for payment source | Attending: Internal Medicine

## 2019-05-24 DIAGNOSIS — Z20822 Contact with and (suspected) exposure to covid-19: Secondary | ICD-10-CM

## 2019-05-25 LAB — NOVEL CORONAVIRUS, NAA: SARS-CoV-2, NAA: NOT DETECTED

## 2019-05-26 ENCOUNTER — Other Ambulatory Visit: Payer: Self-pay

## 2019-05-26 ENCOUNTER — Ambulatory Visit (INDEPENDENT_AMBULATORY_CARE_PROVIDER_SITE_OTHER): Payer: No Typology Code available for payment source

## 2019-05-26 VITALS — BP 125/78 | HR 72 | Wt 142.0 lb

## 2019-05-26 DIAGNOSIS — Z113 Encounter for screening for infections with a predominantly sexual mode of transmission: Secondary | ICD-10-CM

## 2019-05-26 DIAGNOSIS — N898 Other specified noninflammatory disorders of vagina: Secondary | ICD-10-CM

## 2019-05-26 DIAGNOSIS — A64 Unspecified sexually transmitted disease: Secondary | ICD-10-CM

## 2019-05-26 MED ORDER — FLUCONAZOLE 150 MG PO TABS: 150.0000 mg | ORAL_TABLET | Freq: Once | ORAL | 0 refills | Status: AC

## 2019-05-26 MED ORDER — DOXYCYCLINE HYCLATE 100 MG PO CAPS: 100.0000 mg | ORAL_CAPSULE | Freq: Two times a day (BID) | ORAL | 0 refills | Status: DC

## 2019-05-26 NOTE — Progress Notes (Signed)
SUBJECTIVE:  24 y.o. female complains of vaginal discharge for a couple of days. Denies abnormal vaginal bleeding or significant pelvic pain or  fever. No UTI symptoms. Patient does report exposure to STD.  No LMP recorded. IUD OBJECTIVE:  She appears well, afebrile. Urine dipstick: not done   ASSESSMENT:  Vaginal Discharge: small amount  Vaginal Odor: small amount  ```````````````````````````````````````````````````````````````````````````````````````````````````````````````````````````````````````````````````````````   PLAN:  GC, chlamydia, trichomonas, BVAG, CVAG probe sent to lab. Treatment: To be determined once lab results are received ROV prn if symptoms persist or worsen.

## 2019-05-26 NOTE — Addendum Note (Signed)
Addended by: Cheree Ditto, Taelar Gronewold A on: 05/26/2019 03:03 PM   Modules accepted: Orders

## 2019-05-31 ENCOUNTER — Other Ambulatory Visit: Payer: Self-pay | Admitting: Advanced Practice Midwife

## 2019-05-31 DIAGNOSIS — A749 Chlamydial infection, unspecified: Secondary | ICD-10-CM

## 2019-05-31 LAB — CERVICOVAGINAL ANCILLARY ONLY
Bacterial Vaginitis (gardnerella): NEGATIVE
Candida Glabrata: NEGATIVE
Candida Vaginitis: NEGATIVE
Chlamydia: POSITIVE — AB
Comment: NEGATIVE
Comment: NEGATIVE
Comment: NEGATIVE
Comment: NEGATIVE
Comment: NEGATIVE
Comment: NORMAL
Neisseria Gonorrhea: NEGATIVE
Trichomonas: NEGATIVE

## 2019-05-31 MED ORDER — AZITHROMYCIN 500 MG PO TABS: 1000.0000 mg | ORAL_TABLET | Freq: Once | ORAL | 1 refills | Status: AC

## 2019-05-31 NOTE — Progress Notes (Signed)
+   Chlamydia on self swab. Notice via active MyChart  Clayton Bibles, MSN, CNM Certified Nurse Midwife, [facility] for [employer], [facility] 05/31/19 1:40 PM

## 2019-06-02 ENCOUNTER — Encounter: Payer: Self-pay | Admitting: Psychiatry

## 2019-06-02 ENCOUNTER — Ambulatory Visit (INDEPENDENT_AMBULATORY_CARE_PROVIDER_SITE_OTHER): Payer: No Typology Code available for payment source | Admitting: Family Medicine

## 2019-06-02 ENCOUNTER — Ambulatory Visit (INDEPENDENT_AMBULATORY_CARE_PROVIDER_SITE_OTHER): Payer: No Typology Code available for payment source | Admitting: Psychiatry

## 2019-06-02 ENCOUNTER — Encounter: Payer: Self-pay | Admitting: Family Medicine

## 2019-06-02 ENCOUNTER — Other Ambulatory Visit: Payer: Self-pay

## 2019-06-02 VITALS — BP 116/74 | HR 86 | Wt 144.0 lb

## 2019-06-02 DIAGNOSIS — R7989 Other specified abnormal findings of blood chemistry: Secondary | ICD-10-CM

## 2019-06-02 DIAGNOSIS — Z01419 Encounter for gynecological examination (general) (routine) without abnormal findings: Secondary | ICD-10-CM | POA: Diagnosis not present

## 2019-06-02 DIAGNOSIS — R87612 Low grade squamous intraepithelial lesion on cytologic smear of cervix (LGSIL): Secondary | ICD-10-CM | POA: Diagnosis not present

## 2019-06-02 DIAGNOSIS — F3342 Major depressive disorder, recurrent, in full remission: Secondary | ICD-10-CM

## 2019-06-02 DIAGNOSIS — F411 Generalized anxiety disorder: Secondary | ICD-10-CM

## 2019-06-02 DIAGNOSIS — Z124 Encounter for screening for malignant neoplasm of cervix: Secondary | ICD-10-CM

## 2019-06-02 MED ORDER — SERTRALINE HCL 100 MG PO TABS: ORAL_TABLET | ORAL | 0 refills | Status: DC

## 2019-06-02 MED ORDER — BUSPIRONE HCL 10 MG PO TABS: 10.0000 mg | ORAL_TABLET | Freq: Two times a day (BID) | ORAL | 0 refills | Status: DC

## 2019-06-02 NOTE — Progress Notes (Signed)
ANNUAL PAP TODAY LAST PAP 05/06/2018-ABNORMAL CIN-1HPV

## 2019-06-02 NOTE — Progress Notes (Signed)
   GYNECOLOGY ANNUAL PREVENTATIVE CARE ENCOUNTER NOTE  Subjective:   Emily Santos is a 24 y.o. G80P1001 female here for a routine annual gynecologic exam.  Current complaints: saw pysch today- increased zoloft to 150mg  daily and buspar to 10mg  BID. Recently treated for CT- completed doxycycline yesterday. Reports no pelvic pain.   Denies abnormal vaginal bleeding, discharge, pelvic pain, problems with intercourse or other gynecologic concerns.    Gynecologic History No LMP recorded. Patient has had an injection. Contraception: IUD Last Pap: 2020. Results were: abnormal-LSIL Last mammogram: NA  The following portions of the patient's history were reviewed and updated as appropriate: allergies, current medications, past family history, past medical history, past social history, past surgical history and problem list.  Review of Systems Pertinent items are noted in HPI.   Objective:  BP 116/74   Pulse 86   Wt 144 lb (65.3 kg)   BMI 23.24 kg/m  CONSTITUTIONAL: Well-developed, well-nourished female in no acute distress.  HENT:  Normocephalic, atraumatic, External right and left ear normal. Oropharynx is clear and moist EYES:  No scleral icterus.  NECK: Normal range of motion, supple, no masses.  Normal thyroid.  SKIN: Skin is warm and dry. No rash noted. Not diaphoretic. No erythema. No pallor. NEUROLOGIC: Alert and oriented to person, place, and time. Normal reflexes, muscle tone coordination. No cranial nerve deficit noted. PSYCHIATRIC: Normal mood and affect. Normal behavior. Normal judgment and thought content. CARDIOVASCULAR: Normal heart rate noted, regular rhythm. 2+ distal pulses. RESPIRATORY: Effort and breath sounds normal, no problems with respiration noted. BREASTS: no concerns breast exam not ind8icated ABDOMEN: Soft,  no distention noted.  No tenderness, rebound or guarding.  PELVIC: Normal appearing external genitalia; normal appearing vaginal mucosa and cervix.  No  abnormal discharge noted. Pap smear obtained.  IUD string seen. Normal uterine size, no other palpable masses, no uterine or adnexal tenderness. MUSCULOSKELETAL: Normal range of motion.     Assessment and Plan:  1) Annual gynecologic examination with pap smear:  Will follow up results of pap smear and manage accordingly. STI screen also ordered today.  Routine preventative health maintenance measures emphasized.  2) Contraception counseling: Reviewed all forms of birth control options available including abstinence; over the counter/barrier methods; hormonal contraceptive medication including pill, patch, ring, injection,contraceptive implant; hormonal and nonhormonal IUDs; permanent sterilization options including vasectomy and the various tubal sterilization modalities. Risks and benefits reviewed.  Questions were answered.  Written information was also given to the patient to review.  Patient desires IUD, this was prescribed for patient. She will follow up in  104yr for surveillance.  She was told to call with any further questions, or with any concerns about this method of contraception.  Emphasized use of condoms 100% of the time for STI prevention.  1. Well woman exam with routine gynecological exam - VITAMIN D 25 Hydroxy (Vit-D Deficiency, Fractures) - CBC - TSH - Cytology - PAP - HIV Antibody (routine testing w rflx) - RPR - Hepatitis C antibody  2. LGSIL on Pap smear of cervix Repeat pap today   Please refer to After Visit Summary for other counseling recommendations.   Return in about 4 weeks (around 06/30/2019) for RN only, self swab.  3yr, MD, MPH, ABFM Attending [facility] for [facility]

## 2019-06-02 NOTE — Progress Notes (Signed)
Psychiatric Initial Adult Assessment   I connected with  [name] on 06/02/19 by a video enabled telemedicine application and verified that I am speaking with the correct person using two identifiers.   I discussed the limitations of evaluation and management by telemedicine. The patient expressed understanding and agreed to proceed.    Patient Identification: Emily Santos MRN:  [id number] Date of Evaluation:  06/02/2019   Referral Source: Dr. Ernestina Patches  Chief Complaint:   Chief Complaint    Establish Care; Anxiety; Depression     Visit Diagnosis:    ICD-10-CM   1. Generalized anxiety disorder  F41.1   2. MDD (major depressive disorder), recurrent, in full remission ([place])  F33.42     History of Present Illness: This is a 25 year old female with history of MDD, anxiety now seen for psychiatry evaluation and establishment of care.  Patient reported that she has always had anxiety since young age of 57 or 6 years.  As she grew older she started to have sporadic panic attacks.  She she reported that after she had her son at the age of 42 she noticed that she developed significant depressive symptoms.  Until last year she used to have low energy levels, tearfulness, anhedonia, poor concentration and poor sleep.  A few months ago she had some passive suicidal thoughts of why she was alive.  She was started on Zoloft by her OB/GYN last year and that helped her symptoms.  The dose was gradually increased to 100 mg about 2 months ago and patient feels her symptoms of depression have improved remarkably. However she continues to have anxiety.  She stated that she still has knots in her stomach and she feels very nervous when she goes to the grocery store.  She feels like something bad is going to happen or she is going to die.  She informed that starting Zoloft did help her anxiety to some extent.  She also reported having frequent panic attacks and after she was started on Zoloft the  frequency of panic attacks is reduced to maybe once a week.  Recently buspirone 5 mg twice daily was added to her regimen.  However she is only been taking 1 tablet daily and she has noticed very minimal improvement in her anxiety with that. She reported that she worries excessively about minor issues.  She feels on the edge all the time.  She feels that she can rest or relax.  At night when she tries to go to bed her mind races fast and she cannot sleep well.  She will stay up until 1 or 2 AM thinking about all the trivial things that happened during the day. She reported during the episodes of panic attacks she feels her heart races very fast, sweats a lot, feels like she is going to choke or stop breathing and die. He denied any symptom suggestive of hypomania or mania.  She denied any psychotic symptoms.  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  She resides with her father, stepmom and 6-year-old son.  She is in a relationship and has a boyfriend.    Associated Signs/Symptoms: Depression Symptoms:  In remission (Hypo) Manic Symptoms:  Denied Anxiety Symptoms:  Excessive Worry, Panic Symptoms, Psychotic Symptoms:  Denied PTSD Symptoms: Negative  Past Psychiatric History: see HPI  Previous Psychotropic Medications: Yes   Substance Abuse History in the last 12 months:  No.  Consequences of Substance Abuse: Negative  Past Medical History:  Past Medical History:  Diagnosis Date  . Anxiety   . Depression   . Headache     Past Surgical History:  Procedure Laterality Date  . WISDOM TOOTH EXTRACTION      Family Psychiatric History: See below  Family History:  Family History  Problem Relation Age of Onset  . Hyperlipidemia Father   . Hypothyroidism Father   . Migraines Father   . ADD / ADHD Son   . Hyperthyroidism Paternal Aunt   . Hypertension Paternal Grandmother     Social History:   Social History   Socioeconomic History  . Marital status:  Single    Spouse name: Not on file  . Number of children: 1  . Years of education: Not on file  . Highest education level: Associate degree: academic program  Occupational History  . Not on file  Tobacco Use  . Smoking status: Never Smoker  . Smokeless tobacco: Never Used  Substance and Sexual Activity  . Alcohol use: Yes    Alcohol/week: 4.0 standard drinks    Types: 4 Cans of beer per week    Comment: social events  . Drug use: No  . Sexual activity: Yes  Other Topics Concern  . Not on file  Social History Narrative  . Not on file   Social Determinants of Health   Financial Resource Strain:   . Difficulty of Paying Living Expenses: Not on file  Food Insecurity:   . Worried About Programme researcher, broadcasting/film/video in the Last Year: Not on file  . Ran Out of Food in the Last Year: Not on file  Transportation Needs:   . Lack of Transportation (Medical): Not on file  . Lack of Transportation (Non-Medical): Not on file  Physical Activity:   . Days of Exercise per Week: Not on file  . Minutes of Exercise per Session: Not on file  Stress:   . Feeling of Stress : Not on file  Social Connections:   . Frequency of Communication with Friends and Family: Not on file  . Frequency of Social Gatherings with Friends and Family: Not on file  . Attends Religious Services: Not on file  . Active Member of Clubs or Organizations: Not on file  . Attends Banker Meetings: Not on file  . Marital Status: Not on file    Additional Social History: Lives with dad, step-mom, 20 y/o son.  Works as an International aid/development worker at [employer].  Allergies:   Allergies  Allergen Reactions  . Sulfa Antibiotics Rash    Metabolic Disorder Labs: No results found for: HGBA1C, MPG No results found for: PROLACTIN No results found for: CHOL, TRIG, HDL, CHOLHDL, VLDL, LDLCALC Lab Results  Component Value Date   TSH 1.850 05/06/2018    Therapeutic Level Labs: No results found for: LITHIUM No results  found for: CBMZ No results found for: VALPROATE  Current Medications: Current Outpatient Medications  Medication Sig Dispense Refill  . albuterol (VENTOLIN HFA) 108 (90 Base) MCG/ACT inhaler Inhale 2 puffs into the lungs every 4 (four) hours as needed. Okay to fill with albuterol/ventolin/proair 18 g 1  . cetirizine (ZYRTEC) 10 MG tablet Take 10 mg by mouth as needed.     . SUMAtriptan (IMITREX) 100 MG tablet Take 1 tablet (100 mg total) by mouth once as needed for up to 1 dose for migraine. May repeat in 2 hours if headache persists or recurs. 9 tablet 11   No current facility-administered medications for this visit.  Musculoskeletal: Strength & Muscle Tone: unable to assess due to telemed visit Gait & Station: unable to assess due to telemed visit Patient leans: unable to assess due to telemed visit     Psychiatric Specialty Exam: Review of Systems  There were no vitals taken for this visit.There is no height or weight on file to calculate BMI.  General Appearance: Well Groomed  Eye Contact:  Good  Speech:  Clear and Coherent and Normal Rate  Volume:  Normal  Mood:  Euthymic  Affect:  Congruent  Thought Process:  Goal Directed, Linear and Descriptions of Associations: Intact  Orientation:  Full (Time, Place, and Person)  Thought Content: Logical   Suicidal Thoughts:  No  Homicidal Thoughts:  No  Memory:  Recent;   Good Remote;   Good  Judgement:  Fair  Insight:  Fair  Psychomotor Activity:  Normal  Concentration:  Concentration: Good and Attention Span: Good  Recall:  Good  Fund of Knowledge: Good  Language: Good  Akathisia:  Negative  Handed:  Right  AIMS (if indicated): not done  Assets:  Communication Skills Desire for Improvement Financial Resources/Insurance Housing  ADL's:  Intact  Cognition: WNL  Sleep:  Poor   Screenings: GAD-7     Integrated Behavioral Health from 04/26/2019 in Center for [facility] Integrated Behavioral Health  from 04/12/2019 in Center for [facility] Integrated Behavioral Health from 03/25/2019 in Center for [facility] Video Visit from 12/23/2018 in Center for [facility] Video Visit from 12/16/2018 in Center for [facility]  Total GAD-7 Score  18  18  17  17  19     PHQ2-9     Integrated Behavioral Health from 04/26/2019 in Center for [facility] Integrated Behavioral Health from 04/12/2019 in Center for [facility] Integrated Behavioral Health from 03/25/2019 in Center for [facility] Video Visit from 12/23/2018 in Center for [facility] Video Visit from 12/16/2018 in Center for [facility]  PHQ-2 Total Score  1  2  6  3  5   PHQ-9 Total Score  8  17  21  17  21       Assessment and Plan: 24 year old female with history of depression and anxiety now seen for psychiatric evaluation.  Based on her assessment, her symptoms of depression are in remission with help of Zoloft 100 mg daily however she continues to have significant anxiety symptoms suggestive of generalized anxiety disorder.  She has benefited from Zoloft and likes the medication.  She was agreeable to increasing the dose of Zoloft t0 150 mg so that he can help her ongoing anxiety symptoms.  She was also agreeable to increasing the dose of buspirone to 10 mg twice daily for optimal control of anxiety.  Regarding poor sleep, she was advised to try over-the-counter melatonin and if her sleep fails to improve then may consider a prescription medicine at the time of next visit.  1. MDD (major depressive disorder), recurrent, in full remission (H[facility])  - sertraline (ZOLOFT) 100 MG tablet; Take 1 and a half tablet daily (150 mg)  Dispense: 135 tablet; Refill: 0  2. Generalized anxiety disorder  - Increase sertraline (ZOLOFT) 100 MG tablet; Take 1 and a half tablet daily (150 mg)  Dispense: 135 tablet;  Refill: 0 - Increase busPIRone (BUSPAR) 10 MG tablet; Take 1 tablet (10 mg total) by mouth 2 (two) times daily.  Dispense: 180 tablet; Refill: 0  Continue individual therapy. Follow-up  in 6 weeks.  Zena Amos, MD 2/24/20219:17 AM

## 2019-06-03 LAB — RPR: RPR Ser Ql: NONREACTIVE

## 2019-06-03 LAB — HEPATITIS C ANTIBODY: Hep C Virus Ab: <0.1 s/co ratio (ref 0.0–0.9)

## 2019-06-03 LAB — CBC
Hematocrit: 41.4 % (ref 34.0–46.6)
Hemoglobin: 13.9 g/dL (ref 11.1–15.9)
MCH: 31.4 pg (ref 26.6–33.0)
MCHC: 33.6 g/dL (ref 31.5–35.7)
MCV: 94 fL (ref 79–97)
Platelets: 292 10*3/uL (ref 150–450)
RBC: 4.43 x10E6/uL (ref 3.77–5.28)
RDW: 11.7 % (ref 11.7–15.4)
WBC: 8.1 10*3/uL (ref 3.4–10.8)

## 2019-06-03 LAB — VITAMIN D 25 HYDROXY (VIT D DEFICIENCY, FRACTURES): Vit D, 25-Hydroxy: 14.5 ng/mL — ABNORMAL LOW (ref 30.0–100.0)

## 2019-06-03 LAB — TSH: TSH: 1.21 u[IU]/mL (ref 0.450–4.500)

## 2019-06-03 LAB — HIV ANTIBODY (ROUTINE TESTING W REFLEX): HIV Screen 4th Generation wRfx: NONREACTIVE

## 2019-06-03 MED ORDER — VITAMIN D (ERGOCALCIFEROL) 1.25 MG (50000 UNIT) PO CAPS: 50000.0000 [IU] | ORAL_CAPSULE | ORAL | 0 refills | Status: DC

## 2019-06-03 NOTE — Addendum Note (Signed)
Addended by: Geanie Berlin on: 06/03/2019 12:16 PM   Modules accepted: Orders

## 2019-06-04 LAB — CYTOLOGY - PAP: Diagnosis: NEGATIVE

## 2019-06-28 ENCOUNTER — Other Ambulatory Visit: Payer: Self-pay

## 2019-06-28 ENCOUNTER — Ambulatory Visit (INDEPENDENT_AMBULATORY_CARE_PROVIDER_SITE_OTHER): Payer: No Typology Code available for payment source

## 2019-06-28 VITALS — BP 112/68 | HR 80

## 2019-06-28 DIAGNOSIS — Z113 Encounter for screening for infections with a predominantly sexual mode of transmission: Secondary | ICD-10-CM | POA: Diagnosis not present

## 2019-06-28 DIAGNOSIS — N898 Other specified noninflammatory disorders of vagina: Secondary | ICD-10-CM | POA: Diagnosis not present

## 2019-06-28 DIAGNOSIS — Z202 Contact with and (suspected) exposure to infections with a predominantly sexual mode of transmission: Secondary | ICD-10-CM

## 2019-06-28 DIAGNOSIS — A5402 Gonococcal vulvovaginitis, unspecified: Secondary | ICD-10-CM | POA: Diagnosis not present

## 2019-06-28 NOTE — Progress Notes (Signed)
SUBJECTIVE:  24 y.o. female complains of no vaginal discharge at this time. She is here for a  test of cure. Denies abnormal vaginal bleeding or significant pelvic pain or fever. No UTI symptoms. Denies history of known exposure to STD.  No LMP recorded. Patient has had an injection.  OBJECTIVE:  She appears well, afebrile. Urine dipstick:none   ASSESSMENT:  Vaginal Discharge: none  Vaginal Odor: none   PLAN:  GC, chlamydia, trichomonas, BVAG, CVAG probe sent to lab. Treatment: To be determined once lab results are received ROV prn if symptoms persist or worsen.

## 2019-06-29 LAB — CERVICOVAGINAL ANCILLARY ONLY
Bacterial Vaginitis (gardnerella): NEGATIVE
Candida Glabrata: NEGATIVE
Candida Vaginitis: NEGATIVE
Chlamydia: NEGATIVE
Comment: NEGATIVE
Comment: NEGATIVE
Comment: NEGATIVE
Comment: NEGATIVE
Comment: NEGATIVE
Comment: NORMAL
Neisseria Gonorrhea: NEGATIVE
Trichomonas: NEGATIVE

## 2019-07-14 ENCOUNTER — Ambulatory Visit (INDEPENDENT_AMBULATORY_CARE_PROVIDER_SITE_OTHER): Payer: No Typology Code available for payment source | Admitting: Psychiatry

## 2019-07-14 ENCOUNTER — Encounter: Payer: Self-pay | Admitting: Psychiatry

## 2019-07-14 ENCOUNTER — Other Ambulatory Visit: Payer: Self-pay

## 2019-07-14 DIAGNOSIS — F3342 Major depressive disorder, recurrent, in full remission: Secondary | ICD-10-CM | POA: Diagnosis not present

## 2019-07-14 DIAGNOSIS — F411 Generalized anxiety disorder: Secondary | ICD-10-CM

## 2019-07-14 MED ORDER — BUSPIRONE HCL 10 MG PO TABS: 10.0000 mg | ORAL_TABLET | Freq: Two times a day (BID) | ORAL | 0 refills | Status: DC

## 2019-07-14 MED ORDER — SERTRALINE HCL 100 MG PO TABS: ORAL_TABLET | ORAL | 0 refills | Status: DC

## 2019-07-14 NOTE — Progress Notes (Signed)
BH MD/PA/NP OP Progress Note  I connected with  Emily Santos on 07/14/19 by a video enabled telemedicine application and verified that I am speaking with the correct person using two identifiers.   I discussed the limitations of evaluation and management by telemedicine. The patient expressed understanding and agreed to proceed.    07/14/2019 8:42 AM Emily Santos  MRN:  [id number]  Chief Complaint:  " I am doing good."  HPI: Patient reported that she has been doing well.  She informed the increase in the dose of sertraline and buspirone has been helpful.  She has noticed that her frequency of anxiety and panic attacks is reduced.  She stated that she does get anxiety when she has to face stressful circumstances.  Other than that she is doing okay.  She did inform that she left her job about a month ago and she is currently looking for 1.  She is hoping that she will find something soon.  She stated that has added to her stress a little bit however overall things are better.  Visit Diagnosis:    ICD-10-CM   1. Generalized anxiety disorder  F41.1   2. MDD (major depressive disorder), recurrent, in full remission (HCC)  F33.42     Past Psychiatric History: MDD, anxiety  Past Medical History:  Past Medical History:  Diagnosis Date  . Anxiety   . Depression   . Headache     Past Surgical History:  Procedure Laterality Date  . WISDOM TOOTH EXTRACTION      Family Psychiatric History: Son- ADHD  Family History:  Family History  Problem Relation Age of Onset  . Hyperlipidemia Father   . Hypothyroidism Father   . Migraines Father   . ADD / ADHD Son   . Hyperthyroidism Paternal Aunt   . Hypertension Paternal Grandmother     Social History:  Social History   Socioeconomic History  . Marital status: Single    Spouse name: Not on file  . Number of children: 1  . Years of education: Not on file  . Highest education level: Associate degree: academic program   Occupational History  . Not on file  Tobacco Use  . Smoking status: Never Smoker  . Smokeless tobacco: Never Used  Substance and Sexual Activity  . Alcohol use: Yes    Alcohol/week: 4.0 standard drinks    Types: 4 Cans of beer per week    Comment: social events  . Drug use: No  . Sexual activity: Yes  Other Topics Concern  . Not on file  Social History Narrative  . Not on file   Social Determinants of Health   Financial Resource Strain:   . Difficulty of Paying Living Expenses:   Food Insecurity:   . Worried About Programme researcher, broadcasting/film/video in the Last Year:   . Barista in the Last Year:   Transportation Needs:   . Freight forwarder (Medical):   Marland Kitchen Lack of Transportation (Non-Medical):   Physical Activity:   . Days of Exercise per Week:   . Minutes of Exercise per Session:   Stress:   . Feeling of Stress :   Social Connections:   . Frequency of Communication with Friends and Family:   . Frequency of Social Gatherings with Friends and Family:   . Attends Religious Services:   . Active Member of Clubs or Organizations:   . Attends Banker Meetings:   Marland Kitchen Marital Status:  Allergies:  Allergies  Allergen Reactions  . Sulfa Antibiotics Rash    Metabolic Disorder Labs: No results found for: HGBA1C, MPG No results found for: PROLACTIN No results found for: CHOL, TRIG, HDL, CHOLHDL, VLDL, LDLCALC Lab Results  Component Value Date   TSH 1.210 06/02/2019   TSH 1.850 05/06/2018    Therapeutic Level Labs: No results found for: LITHIUM No results found for: VALPROATE No components found for:  CBMZ  Current Medications: Current Outpatient Medications  Medication Sig Dispense Refill  . albuterol (VENTOLIN HFA) 108 (90 Base) MCG/ACT inhaler Inhale 2 puffs into the lungs every 4 (four) hours as needed. Okay to fill with albuterol/ventolin/proair 18 g 1  . busPIRone (BUSPAR) 10 MG tablet Take 1 tablet (10 mg total) by mouth 2 (two) times daily. 180  tablet 0  . cetirizine (ZYRTEC) 10 MG tablet Take 10 mg by mouth as needed.     . sertraline (ZOLOFT) 100 MG tablet Take 1 and a half tablet daily (150 mg) 135 tablet 0  . SUMAtriptan (IMITREX) 100 MG tablet Take 1 tablet (100 mg total) by mouth once as needed for up to 1 dose for migraine. May repeat in 2 hours if headache persists or recurs. 9 tablet 11  . Vitamin D, Ergocalciferol, (DRISDOL) 1.25 MG (50000 UNIT) CAPS capsule Take 1 capsule (50,000 Units total) by mouth every 7 (seven) days. For 8 weeks of treatment 8 capsule 0   No current facility-administered medications for this visit.     Musculoskeletal: Strength & Muscle Tone: unable to assess due to telemed visit Gait & Station: unable to assess due to telemed visit Patient leans: unable to assess due to telemed visit  Psychiatric Specialty Exam: Review of Systems  There were no vitals taken for this visit.There is no height or weight on file to calculate BMI.  General Appearance: Fairly Groomed  Eye Contact:  Good  Speech:  Clear and Coherent and Normal Rate  Volume:  Normal  Mood:  Euthymic  Affect:  Congruent  Thought Process:  Goal Directed and Descriptions of Associations: Intact  Orientation:  Full (Time, Place, and Person)  Thought Content: Logical   Suicidal Thoughts:  No  Homicidal Thoughts:  No  Memory:  Immediate;   Good Recent;   Good  Judgement:  Fair  Insight:  Fair  Psychomotor Activity:  Normal  Concentration:  Concentration: Good and Attention Span: Good  Recall:  Good  Fund of Knowledge: Good  Language: Good  Akathisia:  Negative  Handed:  Right  AIMS (if indicated): not done  Assets:  Communication Skills Desire for Improvement Financial Resources/Insurance Housing Social Support Vocational/Educational  ADL's:  Intact  Cognition: WNL  Sleep:  Good   Screenings: GAD-7     Integrated Behavioral Health from 04/26/2019 in Center for [facility] Integrated Behavioral Health  from 04/12/2019 in Center for [facility] Integrated Behavioral Health from 03/25/2019 in Center for [facility] Video Visit from 12/23/2018 in Center for [facility] Video Visit from 12/16/2018 in Center for [facility]  Total GAD-7 Score  18  18  17  17  19     PHQ2-9     Integrated Behavioral Health from 04/26/2019 in Center for [facility] Integrated Behavioral Health from 04/12/2019 in Center for [facility] Integrated Behavioral Health from 03/25/2019 in Center for [facility] Video Visit from 12/23/2018 in Center for [facility] Video Visit from 12/16/2018 in Center for [facility]  PHQ-2 Total Score  1  2  6  3  5   PHQ-9 Total Score  8  17  21  17  21        Assessment and Plan: Patient reported improvement in her symptoms of anxiety after the dose of Zoloft and BuSpar increased 6 weeks ago.  She is currently looking for a new job after she quit her previous job.  She would like to continue same regimen for now.  1. MDD (major depressive disorder), recurrent, in full remission ([place])  - sertraline (ZOLOFT) 100 MG tablet; Take 1 and a half tablet daily (150 mg)  Dispense: 135 tablet; Refill: 0  2. Generalized anxiety disorder  - busPIRone (BUSPAR) 10 MG tablet; Take 1 tablet (10 mg total) by mouth 2 (two) times daily.  Dispense: 180 tablet; Refill: 0 - sertraline (ZOLOFT) 100 MG tablet; Take 1 and a half tablet daily (150 mg)  Dispense: 135 tablet; Refill: 0  Continue same regimen. F/up in 2 months.  Nev[place] Crane, MD 07/14/2019, 8:42 AM

## 2019-07-14 NOTE — Telephone Encounter (Signed)
Please advise 

## 2019-07-21 ENCOUNTER — Ambulatory Visit: Payer: No Typology Code available for payment source | Attending: Internal Medicine

## 2019-07-21 ENCOUNTER — Other Ambulatory Visit: Payer: Self-pay

## 2019-07-21 DIAGNOSIS — Z23 Encounter for immunization: Secondary | ICD-10-CM

## 2019-07-21 NOTE — Progress Notes (Signed)
   Covid-19 Vaccination Clinic  Name:  Emily Santos    MRN: [id number] DOB: 1996-01-07  07/21/2019  Emily Santos was observed post Covid-19 immunization for 15 minutes without incident. She was provided with Vaccine Information Sheet and instruction to access the V-Safe system.   Emily Santos was instructed to call 911 with any severe reactions post vaccine: Marland Kitchen Difficulty breathing  . Swelling of face and throat  . A fast heartbeat  . A bad rash all over body  . Dizziness and weakness   Immunizations Administered    Name Date Dose VIS Date Route   Pfizer COVID-19 Vaccine 07/21/2019 10:01 AM 0.3 mL 03/19/2019 Intramuscular   Manufacturer: [employer], [employer]   Lot: [id number]   NDC: [address]-8

## 2019-07-22 ENCOUNTER — Ambulatory Visit: Payer: No Typology Code available for payment source

## 2019-08-11 ENCOUNTER — Ambulatory Visit: Payer: No Typology Code available for payment source | Attending: Internal Medicine

## 2019-08-11 DIAGNOSIS — Z23 Encounter for immunization: Secondary | ICD-10-CM

## 2019-08-11 NOTE — Progress Notes (Signed)
   Covid-19 Vaccination Clinic  Name:  KYLINA VULTAGGIO    MRN: [id number] DOB: [birth year]  08/11/2019  Ms. Limb was observed post Covid-19 immunization for 15 minutes without incident. She was provided with Vaccine Information Sheet and instruction to access the V-Safe system.   Ms. Sangiovanni was instructed to call 911 with any severe reactions post vaccine: Marland Kitchen Difficulty breathing  . Swelling of face and throat  . A fast heartbeat  . A bad rash all over body  . Dizziness and weakness   Immunizations Administered    Name Date Dose VIS Date Route   Pfizer COVID-19 Vaccine 08/11/2019 11:07 AM 0.3 mL 06/02/2018 Intramuscular   Manufacturer: [employer], [employer]   Lot: [id number]   NDC: [address]-8

## 2019-09-08 ENCOUNTER — Encounter (HOSPITAL_COMMUNITY): Payer: Self-pay | Admitting: Psychiatry

## 2019-09-08 ENCOUNTER — Telehealth (INDEPENDENT_AMBULATORY_CARE_PROVIDER_SITE_OTHER): Payer: No Payment, Other | Admitting: Psychiatry

## 2019-09-08 ENCOUNTER — Other Ambulatory Visit: Payer: Self-pay

## 2019-09-08 ENCOUNTER — Telehealth: Payer: No Typology Code available for payment source | Admitting: Psychiatry

## 2019-09-08 DIAGNOSIS — F411 Generalized anxiety disorder: Secondary | ICD-10-CM

## 2019-09-08 DIAGNOSIS — F3181 Bipolar II disorder: Secondary | ICD-10-CM

## 2019-09-08 DIAGNOSIS — F3342 Major depressive disorder, recurrent, in full remission: Secondary | ICD-10-CM

## 2019-09-08 MED ORDER — LAMOTRIGINE 25 MG PO TABS: ORAL_TABLET | ORAL | 1 refills | Status: DC

## 2019-09-08 MED ORDER — BUSPIRONE HCL 10 MG PO TABS: 10.0000 mg | ORAL_TABLET | Freq: Two times a day (BID) | ORAL | 0 refills | Status: DC

## 2019-09-08 MED ORDER — SERTRALINE HCL 100 MG PO TABS: ORAL_TABLET | ORAL | 0 refills | Status: DC

## 2019-09-08 NOTE — Progress Notes (Signed)
[place] MD/PA/NP OP Progress Note  Virtual Visit via Video Note  I connected with Emily Santos on 09/08/19 at 11:00 AM EDT by a video enabled telemedicine application and verified that I am speaking with the correct person using two identifiers.  Location: Patient: Home Provider: Clinic   I discussed the limitations of evaluation and management by telemedicine and the availability of in person appointments. The patient expressed understanding and agreed to proceed.  I provided 23 minutes of non-face-to-face time during this encounter.    09/08/2019 10:59 AM Emily Santos  MRN:  [id number]  Chief Complaint:  " My family and I looked it up online and we think that I may have borderline personality disorder."  HPI: Patient reported that her family and herself have been doing some research online and based on several things are going on with her they believe that she may have borderline personality disorder.  Patient was asked to elaborate.  Patient stated that she has frequent mood swings and her mood fluctuates a lot.  She stated that she feels irritable easily.  She gets into altercations with her family members.  She has also been very impulsive and has been spending money on things that she does not need. Writer asked her if she has noticed any fluctuations in her mood in terms of cycling between depression and hypomanic or manic symptoms.  Patient reported that she does notice periods of time when she has a lot of energy and she does not feel the need to sleep.  She also stated that she impulsively buys things that she does not need.  She impulsively will color her hair or get another tattoo when she already has multiple tattoos on her arms.  She stated that her relationships are really unstable and do not last for too long.  She gets easily irritated and can be volatile at times.  Her family is very concerned about her.  Her stepmother has worried about her having bipolar disorder based on  her patterns. Patient denied any history of self-injurious behaviors such as cutting or burning herself.  She did state that getting multiple tattoos is like a way of inflicting pain to herself. She denies any suicidal ideations or suicide attempts.  However she did state that she has had thoughts of why she is alive and she will be better off dead have crossed her mind several times.  She also informed that she is now starting a new job in [place].  She stated that she has realized that she is able to sleep better during the day versus night and therefore she has requested night shifts.  She is starting her job in about 2 weeks from now.  Writer explained to her that based on the symptoms that she describes she likely meets criteria for bipolar 2 disorder. She informed that she stopped seeing her therapist after she started seeing the writer however is agreeable to reach out to her to schedule an appointment.   Visit Diagnosis:    ICD-10-CM   1. Bipolar 2 disorder (HCC)  F31.81   2. Generalized anxiety disorder  F41.1     Past Psychiatric History: MDD, anxiety  Past Medical History:  Past Medical History:  Diagnosis Date   Anxiety    Depression    Headache     Past Surgical History:  Procedure Laterality Date   WISDOM TOOTH EXTRACTION      Family Psychiatric History: Son- ADHD  Family History:  Family History  Problem Relation Age of Onset   Hyperlipidemia Father    Hypothyroidism Father    Migraines Father    ADD / ADHD Son    Hyperthyroidism Paternal Aunt    Hypertension Paternal Grandmother     Social History:  Social History   Socioeconomic History   Marital status: Single    Spouse name: Not on file   Number of children: 1   Years of education: Not on file   Highest education level: Associate degree: academic program  Occupational History   Not on file  Tobacco Use   Smoking status: Never Smoker   Smokeless tobacco: Never Used   Substance and Sexual Activity   Alcohol use: Yes    Alcohol/week: 4.0 standard drinks    Types: 4 Cans of beer per week    Comment: social events   Drug use: No   Sexual activity: Yes  Other Topics Concern   Not on file  Social History Narrative   Not on file   Social Determinants of Health   Financial Resource Strain:    Difficulty of Paying Living Expenses:   Food Insecurity:    Worried About Programme researcher, broadcasting/film/video in the Last Year:    Barista in the Last Year:   Transportation Needs:    Freight forwarder (Medical):    Lack of Transportation (Non-Medical):   Physical Activity:    Days of Exercise per Week:    Minutes of Exercise per Session:   Stress:    Feeling of Stress :   Social Connections:    Frequency of Communication with Friends and Family:    Frequency of Social Gatherings with Friends and Family:    Attends Religious Services:    Active Member of Clubs or Organizations:    Attends Banker Meetings:    Marital Status:     Allergies:  Allergies  Allergen Reactions   Sulfa Antibiotics Rash    Metabolic Disorder Labs: No results found for: HGBA1C, MPG No results found for: PROLACTIN No results found for: CHOL, TRIG, HDL, CHOLHDL, VLDL, LDLCALC Lab Results  Component Value Date   TSH 1.210 06/02/2019   TSH 1.850 05/06/2018    Therapeutic Level Labs: No results found for: LITHIUM No results found for: VALPROATE No components found for:  CBMZ  Current Medications: Current Outpatient Medications  Medication Sig Dispense Refill   albuterol (VENTOLIN HFA) 108 (90 Base) MCG/ACT inhaler Inhale 2 puffs into the lungs every 4 (four) hours as needed. Okay to fill with albuterol/ventolin/proair 18 g 1   busPIRone (BUSPAR) 10 MG tablet Take 1 tablet (10 mg total) by mouth 2 (two) times daily. 180 tablet 0   cetirizine (ZYRTEC) 10 MG tablet Take 10 mg by mouth as needed.      sertraline (ZOLOFT) 100 MG tablet  Take 1 and a half tablet daily (150 mg) 135 tablet 0   SUMAtriptan (IMITREX) 100 MG tablet Take 1 tablet (100 mg total) by mouth once as needed for up to 1 dose for migraine. May repeat in 2 hours if headache persists or recurs. 9 tablet 11   Vitamin D, Ergocalciferol, (DRISDOL) 1.25 MG (50000 UNIT) CAPS capsule Take 1 capsule (50,000 Units total) by mouth every 7 (seven) days. For 8 weeks of treatment 8 capsule 0   No current facility-administered medications for this visit.     Musculoskeletal: Strength & Muscle Tone: unable to assess due to telemed visit Gait &  Station: unable to assess due to telemed visit Patient leans: unable to assess due to telemed visit  Psychiatric Specialty Exam: Review of Systems  There were no vitals taken for this visit.There is no height or weight on file to calculate BMI.  General Appearance: Fairly Groomed  Eye Contact:  Good  Speech:  Clear and Coherent and Normal Rate  Volume:  Normal  Mood:  Euthymic  Affect:  Congruent  Thought Process:  Goal Directed and Descriptions of Associations: Intact  Orientation:  Full (Time, Place, and Person)  Thought Content: Logical   Suicidal Thoughts:  No  Homicidal Thoughts:  No  Memory:  Immediate;   Good Recent;   Good  Judgement:  Fair  Insight:  Fair  Psychomotor Activity:  Normal  Concentration:  Concentration: Good and Attention Span: Good  Recall:  Good  Fund of Knowledge: Good  Language: Good  Akathisia:  Negative  Handed:  Right  AIMS (if indicated): not done  Assets:  Communication Skills Desire for Improvement Financial Resources/Insurance Housing Social Support Vocational/Educational  ADL's:  Intact  Cognition: WNL  Sleep:  Fair   Screenings: GAD-7     Integrated Behavioral Health from 04/26/2019 in Center for [facility] Integrated Behavioral Health from 04/12/2019 in Center for [facility] Integrated Behavioral Health from 03/25/2019 in Center for  [facility] Video Visit from 12/23/2018 in Center for [facility] Video Visit from 12/16/2018 in Center for [facility]  Total GAD-7 Score  18  18  17  17  19     PHQ2-9     Integrated Behavioral Health from 04/26/2019 in Center for [facility] Integrated Behavioral Health from 04/12/2019 in Center for [facility] Integrated Behavioral Health from 03/25/2019 in Center for [facility] Video Visit from 12/23/2018 in Center for [facility] Video Visit from 12/16/2018 in Center for [facility]  PHQ-2 Total Score  1  2  6  3  5   PHQ-9 Total Score  8  17  21  17  21        Assessment and Plan: Patient reported having cycling of depressive and hypomanic symptoms.  She and her family are really concerned about her.  Based on her symptoms it seems like she meets criteria for bipolar 2 disorder.  Patient was agreeable to the recommendation of adding a mood stabilizer to Zoloft.  She was also recommended to reach out to her therapist for resuming therapy sessions.  1. Generalized anxiety disorder - busPIRone (BUSPAR) 10 MG tablet; Take 1 tablet (10 mg total) by mouth 2 (two) times daily.  Dispense: 180 tablet; Refill: 0 - sertraline (ZOLOFT) 100 MG tablet; Take 1 and a half tablet daily (150 mg)  Dispense: 135 tablet; Refill: 0  2. Bipolar 2 disorder (HCC)  - sertraline (ZOLOFT) 100 MG tablet; Take 1 and a half tablet daily (150 mg)  Dispense: 135 tablet; Refill: 0 -Start lamoTRIgine (LAMICTAL) 25 MG tablet; Take 1 tablet daily for 2 weeks then take 2 tablets daily for 2 weeks then take 3 tablets daily  Dispense: 90 tablet; Refill: 1  Follow-up in 6 weeks. Patient was advised to contact her therapist for resuming therapy sessions.    [facility], MD 09/08/2019, 10:59 AM

## 2019-09-09 NOTE — BH Specialist Note (Signed)
Integrated Behavioral Health via Telemedicine Video Visit  09/09/2019 Emily Santos [id number]  Number of Integrated Behavioral Health visits: 7 (2 visits less than 16 min) Session Start time: 2:15  Session End time: 2:45 Total time: 30  Referring Provider: Federico Flake, MD Type of Visit: Video Patient/Family location: Home [facility] Provider location: Center for [facility]  All persons participating in visit: Patient Emily Santos and [facility] [name]    Confirmed patient's address: Yes  Confirmed patient's phone number: Yes  Any changes to demographics: No   Confirmed patient's insurance: Yes  Any changes to patient's insurance: No   Discussed confidentiality: at previous visit  I connected with Emily Santos  by a video enabled telemedicine application and verified that I am speaking with the correct person using two identifiers.     I discussed the limitations of evaluation and management by telemedicine and the availability of in person appointments.  I discussed that the purpose of this visit is to provide behavioral health care while limiting exposure to the novel coronavirus.   Discussed there is a possibility of technology failure and discussed alternative modes of communication if that failure occurs.  I discussed that engaging in this video visit, they consent to the provision of behavioral healthcare and the services will be billed under their insurance.  Patient and/or legal guardian expressed understanding and consented to video visit: Yes   PRESENTING CONCERNS: Patient and/or family reports the following symptoms/concerns: Pt states her primary concern today is worry over adjusting to new medication (Lamictal) with upcoming changes in sleep and work schedule, along with no appetite; pt is attending psychiatry appointments and adhering to treatment.  Duration of problem: Ongoing; Severity of problem:  moderate  STRENGTHS (Protective Factors/Coping Skills): Adhering to treatment; self-aware, supportive family   GOALS ADDRESSED: Patient will: 1.  Reduce and maintain reduction of  symptoms of: anxiety, depression and mood instability  2.  Demonstrate ability to: Increase healthy adjustment to current life circumstances  INTERVENTIONS: Interventions utilized:  Supportive Counseling and Sleep Hygiene Standardized Assessments completed: GAD-7 and PHQ 9  ASSESSMENT: Patient currently experiencing Bipolar 2 disorder, Generalized anxiety disorder, and Major depressive disorder, recurrent, in remission (all as previously diagnosed via psychiatry).   Patient may benefit from continued psychoeducation and brief therapeutic interventions regarding coping with symptoms of depression, anxiety and mood instability .  PLAN: 1. Follow up with behavioral health clinician on : Two weeks 2. Behavioral recommendations:  -Continue taking BH medication as prescribed by psychiatry -Continue attending psychiatry appointments -Call psychiatry about any recommended changes in medication with sleep schedule changes -Prioritize healthy sleep daily, especially when switching from daytime sleep (this week) to late afternoon/early evening sleep (during new work training), then back to daytime sleep. During training, take short naps, as able, over lunch and training breaks.  -Consider having healthy snacks available at new job (ex. Almonds, etc.) to avoid long stretches(over 5 hours) of no food/no protein causing increase in anxiety. Discuss this with PCP and/or psychiatrist at next visit.  -Continue using self-coping strategies as needed  3. Referral(s): Integrated [employer] (In Clinic)  I discussed the assessment and treatment plan with the patient and/or parent/guardian. They were provided an opportunity to ask questions and all were answered. They agreed with the plan and demonstrated an  understanding of the instructions.   They were advised to call back or seek an in-person evaluation if the symptoms worsen or if the  condition fails to improve as anticipated.  Caroleen Hamman [facility]  Depression screen [facility] 2/9 09/13/2019 04/26/2019 04/12/2019 03/25/2019 12/23/2018  Decreased Interest 1 1 1 3 2   Down, Depressed, Hopeless 1 0 1 3 1   PHQ - 2 Score 2 1 2 6 3   Altered sleeping 3 2 3 3 3   Tired, decreased energy 2 2 3 3 2   Change in appetite 3 1 1 3 3   Feeling bad or failure about yourself  2 1 1 2 1   Trouble concentrating 1 - 3 2 3   Moving slowly or fidgety/restless 1 1 3  0 1  Suicidal thoughts 1 0 1 2 1   PHQ-9 Score 15 8 17 21 17    GAD 7 : Generalized Anxiety Score 09/13/2019 04/26/2019 04/12/2019 03/25/2019  Nervous, Anxious, on Edge 2 3 3 3   Control/stop worrying 2 3 3 3   Worry too much - different things 2 3 3 2   Trouble relaxing 1 2 2 2   Restless 1 2 3 3   Easily annoyed or irritable 2 3 3 3   Afraid - awful might happen 1 2 1 1   Total GAD 7 Score 11 18 18  17

## 2019-09-13 ENCOUNTER — Other Ambulatory Visit: Payer: Self-pay

## 2019-09-13 ENCOUNTER — Ambulatory Visit (INDEPENDENT_AMBULATORY_CARE_PROVIDER_SITE_OTHER): Payer: No Payment, Other | Admitting: Clinical

## 2019-09-13 DIAGNOSIS — F3181 Bipolar II disorder: Secondary | ICD-10-CM | POA: Diagnosis not present

## 2019-09-13 DIAGNOSIS — F339 Major depressive disorder, recurrent, unspecified: Secondary | ICD-10-CM

## 2019-09-13 DIAGNOSIS — F411 Generalized anxiety disorder: Secondary | ICD-10-CM | POA: Diagnosis not present

## 2019-09-28 NOTE — BH Specialist Note (Signed)
Integrated Behavioral Health via Telemedicine Phone [facility]) Visit  09/28/2019 Emily Santos [id number]  Number of [place] visits: 8(2 visits less than 15 minutes) Session Start time: 3:47  Session End time: 4:02 Total time: 15  Referring Provider: Caren Macadam, MD Type of Visit: Phone Patient/Family location: Home [facility] Provider location: Center for [place] [facility]  All persons participating in visit: Patient Emily Santos and [place]    Confirmed patient's address: Yes  Confirmed patient's phone number: Yes  Any changes to demographics: No   Confirmed patient's insurance: Yes  Any changes to patient's insurance: No   Discussed confidentiality: at previous visit  I connected with [name]  by a video enabled telemedicine application ([employer]) and verified that I am speaking with the correct person using two identifiers.     I discussed the limitations of evaluation and management by telemedicine and the availability of in person appointments.  I discussed that the purpose of this visit is to provide behavioral health care while limiting exposure to the novel coronavirus.   Discussed there is a possibility of technology failure and discussed alternative modes of communication if that failure occurs.  I discussed that engaging in this virtual visit, they consent to the provision of behavioral healthcare and the services will be billed under their insurance.  Patient and/or legal guardian expressed understanding and consented to virtual visit: Yes   PRESENTING CONCERNS: Patient and/or family reports the following symptoms/concerns: Pt states her primary symptoms are anxiety and worry, attributed to recent adjustment to work and sleep schedule; is adhering to treatment with psychiatry.  Duration of problem: Ongoing ; Severity of problem: moderate  STRENGTHS (Protective Factors/Coping  Skills): Supportive family; adhering to treatment; self-awareness   GOALS ADDRESSED: Patient will: 1.  Reduce symptoms of: anxiety, depression and mood instability  2.  Demonstrate ability to: Increase healthy adjustment to current life circumstances  INTERVENTIONS: Interventions utilized:  Supportive Counseling Standardized Assessments completed: GAD-7 and PHQ 9  ASSESSMENT: Patient currently experiencing Bipolar 2 disorder, Generalized anxiety disorder, and Major depressive disorder, recurrent, in remission (all as previously diagnosed via psychiatry).   Patient may benefit from continued psychoeducation and brief therapeutic interventions regarding coping with symptoms of anxiety, depression, and life stress .  PLAN: 1. Follow up with behavioral health clinician on : As needed 2. Behavioral recommendations:  -Continue attending psychiatry appointments; taking BH medication as prescribed by psychiatry -Continue to prioritize healthy self-care and using daily self-coping strategies 3. Referral(s): [place] (In Clinic)  I discussed the assessment and treatment plan with the patient and/or parent/guardian. They were provided an opportunity to ask questions and all were answered. They agreed with the plan and demonstrated an understanding of the instructions.   They were advised to call back or seek an in-person evaluation if the symptoms worsen or if the condition fails to improve as anticipated.  Caroleen Hamman [facility]  Depression screen [facility] 2/9 10/07/2019 09/13/2019 04/26/2019 04/12/2019 03/25/2019  Decreased Interest 1 1 1 1 3   Down, Depressed, Hopeless 1 1 0 1 3  PHQ - 2 Score 2 2 1 2 6   Altered sleeping 2 3 2 3 3   Tired, decreased energy 2 2 2 3 3   Change in appetite 1 3 1 1 3   Feeling bad or failure about yourself  0 2 1 1 2   Trouble concentrating 1 1 - 3 2  Moving slowly or fidgety/restless 0 1 1  3 0  Suicidal thoughts 0 1 0 1 2  PHQ-9 Score 8 15 8 17  21    GAD 7 : Generalized Anxiety Score 10/07/2019 09/13/2019 04/26/2019 04/12/2019  Nervous, Anxious, on Edge 3 2 3 3   Control/stop worrying 3 2 3 3   Worry too much - different things 2 2 3 3   Trouble relaxing 2 1 2 2   Restless 1 1 2 3   Easily annoyed or irritable 2 2 3 3   Afraid - awful might happen 1 1 2 1   Total GAD 7 Score 14 11 18  18

## 2019-10-07 ENCOUNTER — Ambulatory Visit (INDEPENDENT_AMBULATORY_CARE_PROVIDER_SITE_OTHER): Payer: No Typology Code available for payment source | Admitting: Clinical

## 2019-10-07 DIAGNOSIS — F3340 Major depressive disorder, recurrent, in remission, unspecified: Secondary | ICD-10-CM

## 2019-10-07 DIAGNOSIS — F411 Generalized anxiety disorder: Secondary | ICD-10-CM

## 2019-10-07 DIAGNOSIS — F3181 Bipolar II disorder: Secondary | ICD-10-CM | POA: Diagnosis not present

## 2019-10-18 ENCOUNTER — Encounter (HOSPITAL_COMMUNITY): Payer: Self-pay | Admitting: Psychiatry

## 2019-10-18 ENCOUNTER — Other Ambulatory Visit: Payer: Self-pay

## 2019-10-18 ENCOUNTER — Telehealth (INDEPENDENT_AMBULATORY_CARE_PROVIDER_SITE_OTHER): Payer: No Payment, Other | Admitting: Psychiatry

## 2019-10-18 DIAGNOSIS — F411 Generalized anxiety disorder: Secondary | ICD-10-CM

## 2019-10-18 DIAGNOSIS — F3181 Bipolar II disorder: Secondary | ICD-10-CM

## 2019-10-18 MED ORDER — SERTRALINE HCL 100 MG PO TABS: ORAL_TABLET | ORAL | 0 refills | Status: DC

## 2019-10-18 MED ORDER — BUSPIRONE HCL 10 MG PO TABS: 10.0000 mg | ORAL_TABLET | Freq: Two times a day (BID) | ORAL | 0 refills | Status: DC

## 2019-10-18 MED ORDER — LAMOTRIGINE 100 MG PO TABS: 100.0000 mg | ORAL_TABLET | Freq: Every day | ORAL | 0 refills | Status: DC

## 2019-10-18 NOTE — Progress Notes (Signed)
BH MD/PA/NP OP Progress Note  Virtual Visit via Video Note  I connected with Emily Santos on 10/18/19 at  1:30 PM EDT by a video enabled telemedicine application and verified that I am speaking with the correct person using two identifiers.  Location: Patient: Home Provider: Clinic   I discussed the limitations of evaluation and management by telemedicine and the availability of in person appointments. The patient expressed understanding and agreed to proceed.  I provided 18 minutes of non-face-to-face time during this encounter.    10/18/2019 1:42 PM Emily Santos  MRN:  [id number]  Chief Complaint:  " I am doing better."  HPI: Patient informed that after she started taking Lamictal she has noticed gradual improvement in her mood and irritability.  She stated that she still has days when she gets easily irritated and has mood swings however those days are occurring less frequently and the episodes of irritability are less intense than before.  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  She stated that people around her have said that she is more fun to be around lately and she feels that she is getting on the right track.  She was agreeable to adjusting the dose of Lamictal to 100 mg tablet daily for optimal effect.  Visit Diagnosis:    ICD-10-CM   1. Generalized anxiety disorder  F41.1 sertraline (ZOLOFT) 100 MG tablet    busPIRone (BUSPAR) 10 MG tablet  2. Bipolar 2 disorder (HCC)  F31.81 sertraline (ZOLOFT) 100 MG tablet    lamoTRIgine (LAMICTAL) 100 MG tablet    Past Psychiatric History: MDD, anxiety  Past Medical History:  Past Medical History:  Diagnosis Date  . Anxiety   . Depression   . Headache     Past Surgical History:  Procedure  Laterality Date  . WISDOM TOOTH EXTRACTION      Family Psychiatric History: Son- ADHD  Family History:  Family History  Problem Relation Age of Onset  . Hyperlipidemia Father   . Hypothyroidism Father   . Migraines Father   . ADD / ADHD Son   . Hyperthyroidism Paternal Aunt   . Hypertension Paternal Grandmother     Social History:  Social History   Socioeconomic History  . Marital status: Single    Spouse name: Not on file  . Number of children: 1  . Years of education: Not on file  . Highest education level: Associate degree: academic program  Occupational History  . Not on file  Tobacco Use  . Smoking status: Never Smoker  . Smokeless tobacco: Never Used  Vaping Use  . Vaping Use: Never used  Substance and Sexual Activity  . Alcohol use: Yes    Alcohol/week: 4.0 standard drinks    Types: 4 Cans of beer per week    Comment: social events  . Drug use: No  . Sexual activity: Yes  Other Topics Concern  . Not on file  Social History Narrative  . Not on file   Social Determinants of Health   Financial Resource Strain:   . Difficulty of Paying Living Expenses:   Food Insecurity:   . Worried About Programme researcher, broadcasting/film/video in the Last Year:   . [employer] of Food in the  Last Year:   Transportation Needs:   . Freight forwarder (Medical):   Marland Kitchen Lack of Transportation (Non-Medical):   Physical Activity:   . Days of Exercise per Week:   . Minutes of Exercise per Session:   Stress:   . Feeling of Stress :   Social Connections:   . Frequency of Communication with Friends and Family:   . Frequency of Social Gatherings with Friends and Family:   . Attends Religious Services:   . Active Member of Clubs or Organizations:   . Attends Banker Meetings:   Marland Kitchen Marital Status:     Allergies:  Allergies  Allergen Reactions  . Sulfa Antibiotics Rash    Metabolic Disorder Labs: No results found for: HGBA1C, MPG No results found for: PROLACTIN No results  found for: CHOL, TRIG, HDL, CHOLHDL, VLDL, LDLCALC Lab Results  Component Value Date   TSH 1.210 06/02/2019   TSH 1.850 05/06/2018    Therapeutic Level Labs: No results found for: LITHIUM No results found for: VALPROATE No components found for:  CBMZ  Current Medications: Current Outpatient Medications  Medication Sig Dispense Refill  . albuterol (VENTOLIN HFA) 108 (90 Base) MCG/ACT inhaler Inhale 2 puffs into the lungs every 4 (four) hours as needed. Okay to fill with albuterol/ventolin/proair 18 g 1  . busPIRone (BUSPAR) 10 MG tablet Take 1 tablet (10 mg total) by mouth 2 (two) times daily. 180 tablet 0  . cetirizine (ZYRTEC) 10 MG tablet Take 10 mg by mouth as needed.     . lamoTRIgine (LAMICTAL) 100 MG tablet Take 1 tablet (100 mg total) by mouth daily. 90 tablet 0  . sertraline (ZOLOFT) 100 MG tablet Take 1 and a half tablet daily (150 mg) 135 tablet 0  . SUMAtriptan (IMITREX) 100 MG tablet Take 1 tablet (100 mg total) by mouth once as needed for up to 1 dose for migraine. May repeat in 2 hours if headache persists or recurs. 9 tablet 11  . Vitamin D, Ergocalciferol, (DRISDOL) 1.25 MG (50000 UNIT) CAPS capsule Take 1 capsule (50,000 Units total) by mouth every 7 (seven) days. For 8 weeks of treatment 8 capsule 0   No current facility-administered medications for this visit.     Musculoskeletal: Strength & Muscle Tone: unable to assess due to telemed visit Gait & Station: unable to assess due to telemed visit Patient leans: unable to assess due to telemed visit  Psychiatric Specialty Exam: Review of Systems  There were no vitals taken for this visit.There is no height or weight on file to calculate BMI.  General Appearance: Fairly Groomed  Eye Contact:  Good  Speech:  Clear and Coherent and Normal Rate  Volume:  Normal  Mood:  Euthymic  Affect:  Congruent  Thought Process:  Goal Directed and Descriptions of Associations: Intact  Orientation:  Full (Time, Place, and  Person)  Thought Content: Logical   Suicidal Thoughts:  No  Homicidal Thoughts:  No  Memory:  Immediate;   Good Recent;   Good  Judgement:  Fair  Insight:  Fair  Psychomotor Activity:  Normal  Concentration:  Concentration: Good and Attention Span: Good  Recall:  Good  Fund of Knowledge: Good  Language: Good  Akathisia:  Negative  Handed:  Right  AIMS (if indicated): not done  Assets:  Communication Skills Desire for Improvement Financial Resources/Insurance Housing Social Support Vocational/Educational  ADL's:  Intact  Cognition: WNL  Sleep:  Fair   Screenings: GAD-7  Integrated Behavioral Health from 10/07/2019 in [facility] Integrated Behavioral Health from 09/13/2019 in [facility] for [facility] Health from 04/26/2019 in Center for [facility] Integrated Behavioral Health from 04/12/2019 in Center for [facility] Integrated Behavioral Health from 03/25/2019 in Center for [facility]  Total GAD-7 Score 14 11 18 18 17     PHQ2-9     Integrated Behavioral Health from 10/07/2019 in [facility] for [facility] at [facility] Integrated Behavioral Health from 09/13/2019 in [facility] Integrated Behavioral Health from 04/26/2019 in [facility] for [facility] Integrated Behavioral Health from 04/12/2019 in [facility] for [facility] Integrated Behavioral Health from 03/25/2019 in Center for [facility]  PHQ-2 Total Score 2 2 1 2 6   PHQ-9 Total Score 8 15 8 17 21        Assessment and Plan: Patient appears to be showing improvement in her mood and irritability symptoms.  We will adjust Lamictal to 100 mg for optimal effect.  1. Generalized anxiety disorder  - sertraline (ZOLOFT) 100 MG tablet; Take 1 and a  half tablet daily (150 mg)  Dispense: 135 tablet; Refill: 0 - busPIRone (BUSPAR) 10 MG tablet; Take 1 tablet (10 mg total) by mouth 2 (two) times daily.  Dispense: 180 tablet; Refill: 0  2. Bipolar 2 disorder (HCC)  - sertraline (ZOLOFT) 100 MG tablet; Take 1 and a half tablet daily (150 mg)  Dispense: 135 tablet; Refill: 0 - Increase lamoTRIgine (LAMICTAL) 100 MG tablet; Take 1 tablet (100 mg total) by mouth daily.  Dispense: 90 tablet; Refill: 0   Follow-up in 6 weeks.     [facility], MD 10/18/2019, 1:42 PM

## 2019-11-12 ENCOUNTER — Other Ambulatory Visit: Payer: Self-pay

## 2019-11-12 ENCOUNTER — Ambulatory Visit (INDEPENDENT_AMBULATORY_CARE_PROVIDER_SITE_OTHER): Payer: No Typology Code available for payment source | Admitting: Family Medicine

## 2019-11-12 VITALS — BP 114/70 | HR 72 | Temp 98.7°F | Wt 147.0 lb

## 2019-11-12 DIAGNOSIS — J4540 Moderate persistent asthma, uncomplicated: Secondary | ICD-10-CM | POA: Diagnosis not present

## 2019-11-12 MED ORDER — ALBUTEROL SULFATE HFA 108 (90 BASE) MCG/ACT IN AERS: 2.0000 | INHALATION_SPRAY | RESPIRATORY_TRACT | 1 refills | Status: DC | PRN

## 2019-11-12 MED ORDER — FLUTICASONE PROPIONATE HFA 44 MCG/ACT IN AERO: 2.0000 | INHALATION_SPRAY | Freq: Two times a day (BID) | RESPIRATORY_TRACT | 12 refills | Status: DC

## 2019-11-12 NOTE — Progress Notes (Signed)
   Subjective:    Patient ID: Emily Santos, female    DOB: 03/16/1996, 24 y.o.   MRN: [id number]  HPI Chief Complaint  Patient presents with  . Allergy Induced Asthma    albuterol inhaler.... has had difficulty with breathing x 4 days due to humid day earlier this week.... x 2 day LUQ rib area discomfort.... pt was limited on puffs left on inhaler and believes this is due to that   This is a 24 yo female who presents today for above cc.   She has history of seasonal allergies, usually not bad in summer. She has had more chest tightness, SOB with having to wear mask and recent high heat and humidity. Has been using albuterol several times a week. Occasional wheeze. No fever or cough. No runny nose, nasal congestion.    Review of Systems Per HPI    Objective:   Physical Exam Vitals reviewed.  Constitutional:      Appearance: Normal appearance. She is normal weight.  HENT:     Head: Normocephalic and atraumatic.     Right Ear: Tympanic membrane, ear canal and external ear normal.     Left Ear: Tympanic membrane, ear canal and external ear normal.  Cardiovascular:     Rate and Rhythm: Normal rate and regular rhythm.     Heart sounds: Normal heart sounds.  Pulmonary:     Effort: Pulmonary effort is normal.     Breath sounds: Normal breath sounds.  Skin:    General: Skin is warm and dry.  Neurological:     Mental Status: She is alert.  Psychiatric:        Mood and Affect: Mood normal.        Behavior: Behavior normal.        Thought Content: Thought content normal.        Judgment: Judgment normal.       BP 114/70   Pulse 72   Temp 98.7 F (37.1 C) (Temporal)   Wt 147 lb (66.7 kg)   SpO2 98%   BMI 23.73 kg/m  Wt Readings from Last 3 Encounters:  11/12/19 147 lb (66.7 kg)  06/02/19 144 lb (65.3 kg)  05/26/19 142 lb (64.4 kg)       Assessment & Plan:  1. Moderate persistent asthma without complication -Provided written and verbal information regarding  diagnosis and treatment. - Unable to obtain PFTs due to Covid 19 restrictions - will try ICS and see if improvement, follow up in 3 months, sooner if no improvement - fluticasone (FLOVENT HFA) 44 MCG/ACT inhaler; Inhale 2 puffs into the lungs 2 (two) times daily.  Dispense: 1 each; Refill: 12 - albuterol (VENTOLIN HFA) 108 (90 Base) MCG/ACT inhaler; Inhale 2 puffs into the lungs every 4 (four) hours as needed. Okay to fill with albuterol/ventolin/proair  Dispense: 18 g; Refill: 1  This visit occurred during the SARS-CoV-2 public health emergency.  Safety protocols were in place, including screening questions prior to the visit, additional usage of staff PPE, and extensive cleaning of exam room while observing appropriate contact time as indicated for disinfecting solutions.    Olean Ree, FNP-BC  [facility], [facility]  11/13/2019 7:59 AM

## 2019-11-12 NOTE — Patient Instructions (Addendum)
Good to see you today  For left sided rib pain, take ibuprofen 2-3 tablets every 8 - 12 hours as needed, try heat, gentle stretching  Follow up in 8-12 weeks for annual physical   Asthma, Adult  Asthma is a long-term (chronic) condition in which the airways get tight and narrow. The airways are the breathing passages that lead from the nose and mouth down into the lungs. A person with asthma will have times when symptoms get worse. These are called asthma attacks. They can cause coughing, whistling sounds when you breathe (wheezing), shortness of breath, and chest pain. They can make it hard to breathe. There is no cure for asthma, but medicines and lifestyle changes can help control it. There are many things that can bring on an asthma attack or make asthma symptoms worse (triggers). Common triggers include:  Mold.  Dust.  Cigarette smoke.  Cockroaches.  Things that can cause allergy symptoms (allergens). These include animal skin flakes (dander) and pollen from trees or grass.  Things that pollute the air. These may include household cleaners, wood smoke, smog, or chemical odors.  Cold air, weather changes, and wind.  Crying or laughing hard.  Stress.  Certain medicines or drugs.  Certain foods such as dried fruit, potato chips, and grape juice.  Infections, such as a cold or the flu.  Certain medical conditions or diseases.  Exercise or tiring activities. Asthma may be treated with medicines and by staying away from the things that cause asthma attacks. Types of medicines may include:  Controller medicines. These help prevent asthma symptoms. They are usually taken every day.  Fast-acting reliever or rescue medicines. These quickly relieve asthma symptoms. They are used as needed and provide short-term relief.  Allergy medicines if your attacks are brought on by allergens.  Medicines to help control the body's defense (immune) system. Follow these instructions at  home: Avoiding triggers in your home  Change your heating and air conditioning filter often.  Limit your use of fireplaces and wood stoves.  Get rid of pests (such as roaches and mice) and their droppings.  Throw away plants if you see mold on them.  Clean your floors. Dust regularly. Use cleaning products that do not smell.  Have someone vacuum when you are not home. Use a vacuum cleaner with a HEPA filter if possible.  Replace carpet with wood, tile, or vinyl flooring. Carpet can trap animal skin flakes and dust.  Use allergy-proof pillows, mattress covers, and box spring covers.  Wash bed sheets and blankets every week in hot water. Dry them in a dryer.  Keep your bedroom free of any triggers.  Avoid pets and keep windows closed when things that cause allergy symptoms are in the air.  Use blankets that are made of polyester or cotton.  Clean bathrooms and kitchens with bleach. If possible, have someone repaint the walls in these rooms with mold-resistant paint. Keep out of the rooms that are being cleaned and painted.  Wash your hands often with soap and water. If soap and water are not available, use hand sanitizer.  Do not allow anyone to smoke in your home. General instructions  Take over-the-counter and prescription medicines only as told by your doctor. ? Talk with your doctor if you have questions about how or when to take your medicines. ? Make note if you need to use your medicines more often than usual.  Do not use any products that contain nicotine or tobacco, such as cigarettes  and e-cigarettes. If you need help quitting, ask your doctor.  Stay away from secondhand smoke.  Avoid doing things outdoors when allergen counts are high and when air quality is low.  Wear a ski mask when doing outdoor activities in the winter. The mask should cover your nose and mouth. Exercise indoors on cold days if you can.  Warm up before you exercise. Take time to cool down  after exercise.  Use a peak flow meter as told by your doctor. A peak flow meter is a tool that measures how well the lungs are working.  Keep track of the peak flow meter's readings. Write them down.  Follow your asthma action plan. This is a written plan for taking care of your asthma and treating your attacks.  Make sure you get all the shots (vaccines) that your doctor recommends. Ask your doctor about a flu shot and a pneumonia shot.  Keep all follow-up visits as told by your doctor. This is important. Contact a doctor if:  You have wheezing, shortness of breath, or a cough even while taking medicine to prevent attacks.  The mucus you cough up (sputum) is thicker than usual.  The mucus you cough up changes from clear or white to yellow, green, gray, or bloody.  You have problems from the medicine you are taking, such as: ? A rash. ? Itching. ? Swelling. ? Trouble breathing.  You need reliever medicines more than 2-3 times a week.  Your peak flow reading is still at 50-79% of your personal best after following the action plan for 1 hour.  You have a fever. Get help right away if:  You seem to be worse and are not responding to medicine during an asthma attack.  You are short of breath even at rest.  You get short of breath when doing very little activity.  You have trouble eating, drinking, or talking.  You have chest pain or tightness.  You have a fast heartbeat.  Your lips or fingernails start to turn blue.  You are light-headed or dizzy, or you faint.  Your peak flow is less than 50% of your personal best.  You feel too tired to breathe normally. Summary  Asthma is a long-term (chronic) condition in which the airways get tight and narrow. An asthma attack can make it hard to breathe.  Asthma cannot be cured, but medicines and lifestyle changes can help control it.  Make sure you understand how to avoid triggers and how and when to use your  medicines. This information is not intended to replace advice given to you by your health care provider. Make sure you discuss any questions you have with your health care provider. Document Revised: 05/28/2018 Document Reviewed: 04/29/2016 Elsevier Patient Education  2020 [employer].

## 2019-11-13 ENCOUNTER — Encounter: Payer: Self-pay | Admitting: Family Medicine

## 2019-11-13 DIAGNOSIS — J4540 Moderate persistent asthma, uncomplicated: Secondary | ICD-10-CM | POA: Insufficient documentation

## 2019-12-02 ENCOUNTER — Other Ambulatory Visit: Payer: No Typology Code available for payment source

## 2019-12-02 ENCOUNTER — Other Ambulatory Visit: Payer: Self-pay

## 2019-12-02 DIAGNOSIS — R7989 Other specified abnormal findings of blood chemistry: Secondary | ICD-10-CM

## 2019-12-09 ENCOUNTER — Other Ambulatory Visit: Payer: Self-pay

## 2019-12-09 ENCOUNTER — Telehealth (HOSPITAL_COMMUNITY): Payer: No Payment, Other | Admitting: Psychiatry

## 2019-12-09 ENCOUNTER — Telehealth (HOSPITAL_COMMUNITY): Payer: No Typology Code available for payment source | Admitting: Psychiatry

## 2019-12-10 ENCOUNTER — Telehealth (HOSPITAL_COMMUNITY): Payer: No Payment, Other | Admitting: Psychiatry

## 2019-12-29 ENCOUNTER — Ambulatory Visit (INDEPENDENT_AMBULATORY_CARE_PROVIDER_SITE_OTHER): Payer: No Typology Code available for payment source

## 2019-12-29 ENCOUNTER — Other Ambulatory Visit: Payer: Self-pay

## 2019-12-29 DIAGNOSIS — Z23 Encounter for immunization: Secondary | ICD-10-CM | POA: Diagnosis not present

## 2019-12-29 NOTE — Progress Notes (Signed)
Chart reviewed for nurse visit. Agree with plan of care.   Marylene Land, CNM 12/29/2019 4:43 PM

## 2020-01-10 ENCOUNTER — Ambulatory Visit: Payer: No Typology Code available for payment source | Admitting: Family Medicine

## 2020-01-12 ENCOUNTER — Other Ambulatory Visit: Payer: Self-pay | Admitting: Physician Assistant

## 2020-02-21 ENCOUNTER — Other Ambulatory Visit: Payer: Self-pay | Admitting: Dermatology

## 2020-03-09 ENCOUNTER — Other Ambulatory Visit: Payer: Self-pay

## 2020-03-09 MED ORDER — FLUCONAZOLE 150 MG PO TABS: 150.0000 mg | ORAL_TABLET | Freq: Once | ORAL | 3 refills | Status: DC

## 2020-03-12 ENCOUNTER — Emergency Department (HOSPITAL_BASED_OUTPATIENT_CLINIC_OR_DEPARTMENT_OTHER)
Admission: EM | Admit: 2020-03-12 | Discharge: 2020-03-12 | Disposition: A | Discharge status: Home / Self Care | Payer: No Typology Code available for payment source | Attending: Emergency Medicine | Admitting: Emergency Medicine

## 2020-03-12 ENCOUNTER — Encounter (HOSPITAL_BASED_OUTPATIENT_CLINIC_OR_DEPARTMENT_OTHER): Payer: Self-pay | Admitting: *Deleted

## 2020-03-12 ENCOUNTER — Emergency Department (HOSPITAL_BASED_OUTPATIENT_CLINIC_OR_DEPARTMENT_OTHER): Payer: No Typology Code available for payment source

## 2020-03-12 ENCOUNTER — Other Ambulatory Visit: Payer: Self-pay

## 2020-03-12 DIAGNOSIS — J4540 Moderate persistent asthma, uncomplicated: Secondary | ICD-10-CM | POA: Diagnosis not present

## 2020-03-12 DIAGNOSIS — R0789 Other chest pain: Secondary | ICD-10-CM | POA: Diagnosis not present

## 2020-03-12 DIAGNOSIS — R079 Chest pain, unspecified: Secondary | ICD-10-CM

## 2020-03-12 DIAGNOSIS — Z7951 Long term (current) use of inhaled steroids: Secondary | ICD-10-CM | POA: Insufficient documentation

## 2020-03-12 HISTORY — DX: Unspecified asthma, uncomplicated: J45.909

## 2020-03-12 LAB — CBC
HCT: 43.9 % (ref 36.0–46.0)
Hemoglobin: 14.4 g/dL (ref 12.0–15.0)
MCH: 31 pg (ref 26.0–34.0)
MCHC: 32.8 g/dL (ref 30.0–36.0)
MCV: 94.4 fL (ref 80.0–100.0)
Platelets: 250 10*3/uL (ref 150–400)
RBC: 4.65 MIL/uL (ref 3.87–5.11)
RDW: 12.3 % (ref 11.5–15.5)
WBC: 6.9 10*3/uL (ref 4.0–10.5)
nRBC: 0 % (ref 0.0–0.2)

## 2020-03-12 LAB — BASIC METABOLIC PANEL
Anion gap: 10 (ref 5–15)
BUN: 12 mg/dL (ref 6–20)
CO2: 26 mmol/L (ref 22–32)
Calcium: 9.2 mg/dL (ref 8.9–10.3)
Chloride: 100 mmol/L (ref 98–111)
Creatinine, Ser: 0.81 mg/dL (ref 0.44–1.00)
GFR, Estimated: >60 mL/min (ref >60)
Glucose, Bld: 90 mg/dL (ref 70–99)
Potassium: 3.8 mmol/L (ref 3.5–5.1)
Sodium: 136 mmol/L (ref 135–145)

## 2020-03-12 LAB — TROPONIN I (HIGH SENSITIVITY): Troponin I (High Sensitivity): <2 ng/L (ref <18)

## 2020-03-12 MED ORDER — LIDOCAINE VISCOUS HCL 2 % MT SOLN
15.0000 mL | Freq: Once | OROMUCOSAL | Status: AC
  Administered 2020-03-12: 15 mL via ORAL
  Filled 2020-03-12: qty 15

## 2020-03-12 MED ORDER — SUCRALFATE 1 G PO TABS: 1.0000 g | ORAL_TABLET | Freq: Three times a day (TID) | ORAL | 0 refills | Status: DC

## 2020-03-12 MED ORDER — OMEPRAZOLE 20 MG PO TBDD
1.0000 | DELAYED_RELEASE_TABLET | Freq: Every day | ORAL | 0 refills | Status: DC
Start: 2020-03-12 — End: 2020-03-13

## 2020-03-12 MED ORDER — ALUM & MAG HYDROXIDE-SIMETH 200-200-20 MG/5ML PO SUSP
30.0000 mL | Freq: Once | ORAL | Status: AC
  Administered 2020-03-12: 30 mL via ORAL
  Filled 2020-03-12: qty 30

## 2020-03-12 NOTE — ED Notes (Signed)
Pt sts she notices some improvement of dullness, but is still unable to lie on her stomach d/t "it feels like everything is going to fall out of my chest."

## 2020-03-12 NOTE — Discharge Instructions (Signed)
We suspect that your symptoms are because of esophagitis or GERD.  Please take the medications prescribed and follow-up with your primary care doctor in 1 to 2 weeks.  As discussed, return to the ER if you start having worsening chest pain, pain radiating down your back or shoulder, or if you start having bloody vomitus, fevers, chills.

## 2020-03-12 NOTE — ED Provider Notes (Signed)
[facility] EMERGENCY DEPARTMENT Provider Note   CSN: [id number] Arrival date & time: 03/12/20  1304     History Chief Complaint  Patient presents with  . Chest Pain    Emily Santos is a 24 y.o. female.  HPI  HPI: A 24 year old patient presents for evaluation of chest pain. Initial onset of pain was more than 6 hours ago. The patient's chest pain is described as heaviness/pressure/tightness and is not worse with exertion. The patient's chest pain is not middle- or left-sided, is not well-localized, is not sharp and does not radiate to the arms/jaw/neck. The patient does not complain of nausea and denies diaphoresis. The patient has no history of stroke, has no history of peripheral artery disease, has not smoked in the past 90 days, denies any history of treated diabetes, has no relevant family history of coronary artery disease (first degree relative at less than age 80), is not hypertensive, has no history of hypercholesterolemia and does not have an elevated BMI (>=30).   Patient comes in a chief complaint of central chest pain.  Pain has been present since 4:30 AM yesterday.  Pain is worse with deep inspiration and with certain positions like laying flat and laying on her stomach.  P.o. intake has been normal.  No nausea, vomiting.  No history of GERD.  Pain is not exertional or worse with any kind of movement.  No history of similar pain.  Patient denies any heavy smoking, drinking, NSAID use.  Over-the-counter medications including GERD medicine have not helped. Pt has no hx of PE, DVT and denies any exogenous hormone (testosterone / estrogen) use, long distance travels or surgery in the past 6 weeks, active cancer, recent immobilization.   Past Medical History:  Diagnosis Date  . Anxiety   . Asthma   . Depression   . Headache     Patient Active Problem List   Diagnosis Date Noted  . Moderate persistent asthma without complication 11/13/2019  . Bipolar 2 disorder  (HCC) 09/08/2019  . Generalized anxiety disorder 06/02/2019  . MDD (major depressive disorder), recurrent, in full remission (HCC) 06/02/2019  . LGSIL on Pap smear of cervix 06/02/2019  . Wheeze 01/28/2019  . Anxiety and depression 12/16/2018  . Migraine without aura, not intractable, without status migrainosus 07/24/2018  . Upper respiratory tract infection 06/23/2018  . Orbital pain, left 01/31/2016  . Medication overuse headache 01/31/2016  . 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 WISDOM TOOTH EXTRACTION       OB History    Gravida  1   Para  1   Term  1   Preterm      AB      Living  1     SAB      TAB      Ectopic      Multiple      Live Births  1           Family History  Problem Relation Age of Onset  . Hyperlipidemia Father   . Hypothyroidism Father   . Migraines Father   . ADD / ADHD Son   . Hyperthyroidism Paternal Aunt   . Hypertension Paternal Grandmother     Social History   Tobacco Use  . Smoking status: Never Smoker  . Smokeless tobacco: Never Used  Vaping Use  . Vaping Use: Never used  Substance Use Topics  .  Alcohol use: Yes    Alcohol/week: 4.0 standard drinks    Types: 4 Cans of beer per week    Comment: social events  . Drug use: No    Home Medications Prior to Admission medications   Medication Sig Start Date End Date Taking? Authorizing Provider  albuterol (VENTOLIN HFA) 108 (90 Base) MCG/ACT inhaler Inhale 2 puffs into the lungs every 4 (four) hours as needed. Okay to fill with albuterol/ventolin/proair 11/12/19   Emi Belfast, FNP  busPIRone (BUSPAR) 10 MG tablet Take 1 tablet (10 mg total) by mouth 2 (two) times daily. 10/18/19   Zena Amos, MD  cetirizine (ZYRTEC) 10 MG tablet Take 10 mg by mouth as needed.     [provider]  fluticasone (FLOVENT HFA) 44 MCG/ACT inhaler Inhale 2 puffs into the lungs 2 (two) times  daily. 11/12/19   Emi Belfast, FNP  lamoTRIgine (LAMICTAL) 100 MG tablet Take 1 tablet (100 mg total) by mouth daily. 10/18/19   Zena Amos, MD  Omeprazole 20 MG TBDD Take 1 tablet by mouth daily. 03/12/20   Derwood Kaplan, MD  sertraline (ZOLOFT) 100 MG tablet Take 1 and a half tablet daily (150 mg) 10/18/19   Zena Amos, MD  sucralfate (CARAFATE) 1 g tablet Take 1 tablet (1 g total) by mouth 4 (four) times daily -  with meals and at bedtime. 03/12/20   Derwood Kaplan, MD  SUMAtriptan (IMITREX) 100 MG tablet Take 1 tablet (100 mg total) by mouth once as needed for up to 1 dose for migraine. May repeat in 2 hours if headache persists or recurs. 04/24/18   Glyn Ade, Scot Jun, PA-C    Allergies    Sulfa antibiotics  Review of Systems   Review of Systems  Constitutional: Positive for activity change.  Respiratory: Positive for chest tightness. Negative for shortness of breath.   Gastrointestinal: Negative for nausea and vomiting.  Hematological: Does not bruise/bleed easily.  All other systems reviewed and are negative.   Physical Exam Updated Vital Signs BP 107/68   Pulse (!) 58   Temp 98.2 F (36.8 C) (Oral)   Resp 17   Ht 5\' 6"  (1.676 m)   Wt 64.9 kg   SpO2 100%   BMI 23.08 kg/m   Physical Exam Vitals and nursing note reviewed.  Constitutional:      Appearance: She is well-developed.  HENT:     Head: Normocephalic and atraumatic.  Cardiovascular:     Rate and Rhythm: Normal rate.  Pulmonary:     Effort: Pulmonary effort is normal.  Abdominal:     General: Bowel sounds are normal. There is no abdominal bruit.  Musculoskeletal:     Cervical back: Normal range of motion and neck supple.  Skin:    General: Skin is warm and dry.  Neurological:     Mental Status: She is alert and oriented to person, place, and time.     ED Results / Procedures / Treatments   Labs (all labs ordered are listed, but only abnormal results are displayed) Labs Reviewed  BASIC  METABOLIC PANEL  CBC  PREGNANCY, URINE  TROPONIN I (HIGH SENSITIVITY)    EKG EKG Interpretation  Date/Time:  Sunday March 12 2020 13:08:54 EST Ventricular Rate:  81 PR Interval:  98 QRS Duration: 72 QT Interval:  366 QTC Calculation: 425 R Axis:   90 Text Interpretation: Sinus rhythm with sinus arrhythmia with short PR Rightward axis Borderline ECG Inferior Q waves noted No old  tracing to compare Confirmed by Derwood Kaplan [phone]) on 03/12/2020 3:23:52 PM   Radiology DG Chest 2 View  Result Date: 03/12/2020 CLINICAL DATA:  Chest pain protocol EXAM: CHEST - 2 VIEW COMPARISON:  June 29, 2015 FINDINGS: The heart size and mediastinal contours are within normal limits. Both lungs are clear. The visualized skeletal structures are unremarkable. IMPRESSION: No active cardiopulmonary disease. Electronically Signed   By: Gerome Sam III M.D   On: 03/12/2020 13:23    Procedures Procedures (including critical care time)  Medications Ordered in ED Medications  alum & mag hydroxide-simeth (MAALOX/MYLANTA) 200-200-20 MG/5ML suspension 30 mL (30 mLs Oral Given 03/12/20 1539)    And  lidocaine (XYLOCAINE) 2 % viscous mouth solution 15 mL (15 mLs Oral Given 03/12/20 1539)    ED Course  I have reviewed the triage vital signs and the nursing notes.  Pertinent labs & imaging results that were available during my care of the patient were reviewed by me and considered in my medical decision making (see chart for details).    MDM Rules/Calculators/A&P HEAR Score: 0                       PERC neg.  DDx includes: Pancreatitis Hepatobiliary pathology including cholecystitis Gastritis/PUD SBO ACS syndrome Aortic Dissection  Patient comes in a chief complaint of chest pain She is having central chest pain that is nonradiating.  The pain is not worse with leaning forward, in fact it is worse when she lays flat or when she is prone.  On exam she has no abdominal tenderness and there is  no reproducible chest wall tenderness.  Cardiac exam is normal and reassuring.  Suspect esophagitis versus esophageal spasm.  GI cocktail did numb her pain.  Pain has been constant for more than 24 hours now.  Advised PCP follow-up to look for conditions like H pylori/PUD. We will start her on omeprazole and Carafate.   Final Clinical Impression(s) / ED Diagnoses Final diagnoses:  Central chest pain    Rx / DC Orders ED Discharge Orders         Ordered    Omeprazole 20 MG TBDD  Daily        03/12/20 1635    sucralfate (CARAFATE) 1 g tablet  3 times daily with meals & bedtime        03/12/20 1635           Derwood Kaplan, MD 03/12/20 1649

## 2020-03-12 NOTE — ED Triage Notes (Signed)
Pt reports generalized chest pain since yesterday. Reports increased pain with deep breath. She took tums without relief

## 2020-03-12 NOTE — ED Notes (Signed)
ED Provider at bedside. 

## 2020-03-13 ENCOUNTER — Ambulatory Visit (INDEPENDENT_AMBULATORY_CARE_PROVIDER_SITE_OTHER): Payer: No Typology Code available for payment source

## 2020-03-13 ENCOUNTER — Other Ambulatory Visit (HOSPITAL_COMMUNITY)
Admission: RE | Admit: 2020-03-13 | Discharge: 2020-03-13 | Disposition: A | Discharge status: Home / Self Care | Payer: No Typology Code available for payment source | Source: Ambulatory Visit | Attending: Obstetrics & Gynecology | Admitting: Obstetrics & Gynecology

## 2020-03-13 ENCOUNTER — Other Ambulatory Visit: Payer: Self-pay | Admitting: Obstetrics & Gynecology

## 2020-03-13 DIAGNOSIS — Z113 Encounter for screening for infections with a predominantly sexual mode of transmission: Secondary | ICD-10-CM

## 2020-03-13 MED ORDER — SUCRALFATE 1 G PO TABS
1.0000 g | ORAL_TABLET | Freq: Three times a day (TID) | ORAL | 0 refills | Status: DC
Start: 2020-03-13 — End: 2021-07-31

## 2020-03-13 MED ORDER — OMEPRAZOLE 20 MG PO TBDD
1.0000 | DELAYED_RELEASE_TABLET | Freq: Every day | ORAL | 0 refills | Status: DC
Start: 2020-03-13 — End: 2020-05-16

## 2020-03-13 NOTE — Progress Notes (Signed)
SUBJECTIVE:  24 y.o. female request STD screening.  Denies abnormal vaginal bleeding or significant pelvic pain or fever. No UTI symptoms. Denies history of known exposure to STD.  No LMP recorded. (Menstrual status: IUD).  OBJECTIVE:  She appears well, afebrile. Urine dipstick: not done.  ASSESSMENT:  STD Screen   PLAN:  GC, chlamydia, trichomonas, BVAG, CVAG probe sent to lab. Treatment: To be determined once lab results are received ROV prn if symptoms persist or worsen.  ** Pt request if meds from ED can be sent to new pharmacy.

## 2020-03-13 NOTE — Progress Notes (Signed)
Patient was assessed and managed by nursing staff during this encounter. I have reviewed the chart and agree with the documentation and plan. I have also made any necessary editorial changes.  Jaynie Collins, MD 03/13/2020 10:21 AM

## 2020-03-16 ENCOUNTER — Telehealth: Payer: Self-pay

## 2020-03-16 ENCOUNTER — Other Ambulatory Visit: Payer: Self-pay

## 2020-03-16 LAB — CERVICOVAGINAL ANCILLARY ONLY
Bacterial Vaginitis (gardnerella): POSITIVE — AB
Candida Glabrata: NEGATIVE
Candida Vaginitis: NEGATIVE
Chlamydia: NEGATIVE
Comment: NEGATIVE
Comment: NEGATIVE
Comment: NEGATIVE
Comment: NEGATIVE
Comment: NEGATIVE
Comment: NORMAL
Neisseria Gonorrhea: NEGATIVE
Trichomonas: NEGATIVE

## 2020-03-16 MED ORDER — METRONIDAZOLE 500 MG PO TABS: 500.0000 mg | ORAL_TABLET | Freq: Two times a day (BID) | ORAL | 0 refills | Status: AC

## 2020-03-16 NOTE — Telephone Encounter (Signed)
Pt called and given results. Medications sent in to pharmacy. Pt verbalizes understanding.

## 2020-03-27 ENCOUNTER — Encounter: Payer: Self-pay | Admitting: Radiology

## 2020-05-16 ENCOUNTER — Other Ambulatory Visit: Payer: Self-pay | Admitting: Family Medicine

## 2020-05-16 ENCOUNTER — Ambulatory Visit
Admission: RE | Admit: 2020-05-16 | Discharge: 2020-05-16 | Disposition: A | Discharge status: Home / Self Care | Payer: No Typology Code available for payment source | Source: Ambulatory Visit | Attending: Family Medicine | Admitting: Family Medicine

## 2020-05-16 ENCOUNTER — Other Ambulatory Visit: Payer: Self-pay

## 2020-05-16 VITALS — BP 109/77 | HR 87 | Temp 99.2°F | Resp 18

## 2020-05-16 DIAGNOSIS — M545 Low back pain, unspecified: Secondary | ICD-10-CM

## 2020-05-16 LAB — POCT URINALYSIS DIP (MANUAL ENTRY)
Bilirubin, UA: NEGATIVE
Glucose, UA: NEGATIVE mg/dL
Ketones, POC UA: NEGATIVE mg/dL
Leukocytes, UA: NEGATIVE
Nitrite, UA: NEGATIVE
Protein Ur, POC: NEGATIVE mg/dL
Spec Grav, UA: 1.025 (ref 1.010–1.025)
Urobilinogen, UA: 0.2 E.U./dL
pH, UA: 5.5 (ref 5.0–8.0)

## 2020-05-16 LAB — POCT URINE PREGNANCY: Preg Test, Ur: NEGATIVE

## 2020-05-16 MED ORDER — KETOROLAC TROMETHAMINE 30 MG/ML IJ SOLN
30.0000 mg | Freq: Once | INTRAMUSCULAR | Status: AC
  Administered 2020-05-16: 30 mg via INTRAMUSCULAR

## 2020-05-16 MED ORDER — CYCLOBENZAPRINE HCL 5 MG PO TABS: 5.0000 mg | ORAL_TABLET | Freq: Three times a day (TID) | ORAL | 0 refills | Status: DC | PRN

## 2020-05-16 NOTE — Discharge Instructions (Addendum)
Your urine did not show any infection Toradol given here for pain. I believe the pain to be muscular.  I am giving you a muscle relaxant to use as needed.  Continue the Ibuprofen 600 mg and tylenol 1000 mg every 8 hours as needed.  Heat to the back Gentle stretching  Follow up as needed for continued or worsening symptoms

## 2020-05-16 NOTE — ED Triage Notes (Signed)
Pt presents today with new onset lower back pain that began on 05/14/20, radiates down both legs, pain 7/10, denies injury.

## 2020-05-16 NOTE — ED Provider Notes (Signed)
[name]    CSN: [id number] Arrival date & time: 05/16/20  0844      History   Chief Complaint Chief Complaint  Patient presents with  . Back Pain  . Leg Pain    HPI MARGRETTE WYNIA is a 25 y.o. female.   Patient is a 25 year old female who presents today with lower back pain.  This began on 2/6.  No precipitating factors leading up to the back pain.  Started suddenly. Worse with certain movements.  Describes the pain as dull, aching.  Heat helps.  She has been taking Tylenol and ibuprofen which dull the pain.  No dysuria, hematuria, urinary frequency, fever or flank pain. Patient's last menstrual period was 04/20/2020.  Heaviness in both legs but no numbness, tingling, weakness in extremities.      Past Medical History:  Diagnosis Date  . Anxiety   . Asthma   . Depression   . Headache     Patient Active Problem List   Diagnosis Date Noted  . Moderate persistent asthma without complication 11/13/2019  . Bipolar 2 disorder (HCC) 09/08/2019  . Generalized anxiety disorder 06/02/2019  . MDD (major depressive disorder), recurrent, in full remission (HCC) 06/02/2019  . LGSIL on Pap smear of cervix 06/02/2019  . Wheeze 01/28/2019  . Anxiety and depression 12/16/2018  . Migraine without aura, not intractable, without status migrainosus 07/24/2018  . Upper respiratory tract infection 06/23/2018  . Orbital pain, left 01/31/2016  . Medication overuse headache 01/31/2016  . 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 WISDOM TOOTH EXTRACTION      OB History    Gravida  1   Para  1   Term  1   Preterm      AB      Living  1     SAB      IAB      Ectopic      Multiple      Live Births  1            Home Medications    Prior to Admission medications   Medication Sig Start Date End Date Taking? Authorizing Provider  albuterol (VENTOLIN HFA) 108 (90  Base) MCG/ACT inhaler Inhale 2 puffs into the lungs every 4 (four) hours as needed. Okay to fill with albuterol/ventolin/proair 11/12/19  Yes Emi Belfast, FNP  busPIRone (BUSPAR) 10 MG tablet Take 1 tablet (10 mg total) by mouth 2 (two) times daily. 10/18/19  Yes Zena Amos, MD  cyclobenzaprine (FLEXERIL) 5 MG tablet Take 1 tablet (5 mg total) by mouth 3 (three) times daily as needed for muscle spasms. 05/16/20  Yes Marzetta Lanza A, NP  lamoTRIgine (LAMICTAL) 100 MG tablet Take 1 tablet (100 mg total) by mouth daily. 10/18/19  Yes Zena Amos, MD  sertraline (ZOLOFT) 100 MG tablet Take 1 and a half tablet daily (150 mg) 10/18/19  Yes Zena Amos, MD  SUMAtriptan (IMITREX) 100 MG tablet Take 1 tablet (100 mg total) by mouth once as needed for up to 1 dose for migraine. May repeat in 2 hours if headache persists or recurs. 04/24/18  Yes Teague Edwena Blow, PA-C  cetirizine (ZYRTEC) 10 MG tablet Take 10 mg by mouth as needed.     [provider]  fluticasone (FLOVENT HFA) 44 MCG/ACT inhaler Inhale 2 puffs into the lungs 2 (two) times daily. 11/12/19 05/16/20  Emi Belfast, FNP  Omeprazole 20 MG TBDD Take 1 tablet by mouth daily. 03/13/20 05/16/20  Anyanwu, Jethro Bastos, MD  sucralfate (CARAFATE) 1 g tablet Take 1 tablet (1 g total) by mouth 4 (four) times daily -  with meals and at bedtime. 03/13/20 05/16/20  Tereso Newcomer, MD    Family History Family History  Problem Relation Age of Onset  . Hyperlipidemia Father   . Hypothyroidism Father   . Migraines Father   . ADD / ADHD Son   . Hyperthyroidism Paternal Aunt   . Hypertension Paternal Grandmother     Social History Social History   Tobacco Use  . Smoking status: Never Smoker  . Smokeless tobacco: Never Used  Vaping Use  . Vaping Use: Never used  Substance Use Topics  . Alcohol use: Yes    Alcohol/week: 4.0 standard drinks    Types: 4 Cans of beer per week    Comment: social events  . Drug use: No     Allergies    Sulfa antibiotics   Review of Systems Review of Systems   Physical Exam Triage Vital Signs ED Triage Vitals  Enc Vitals Group     BP 05/16/20 0853 109/77     Pulse Rate 05/16/20 0853 87     Resp 05/16/20 0853 18     Temp 05/16/20 0853 99.2 F (37.3 C)     Temp Source 05/16/20 0853 Oral     SpO2 05/16/20 0853 98 %     Weight --      Height --      Head Circumference --      Peak Flow --      Pain Score 05/16/20 0855 6     Pain Loc --      Pain Edu? --      Excl. in GC? --    No data found.  Updated Vital Signs BP 109/77 (BP Location: Left Arm)   Pulse 87   Temp 99.2 F (37.3 C) (Oral)   Resp 18   LMP 04/20/2020   SpO2 98%   Visual Acuity Right Eye Distance:   Left Eye Distance:   Bilateral Distance:    Right Eye Near:   Left Eye Near:    Bilateral Near:     Physical Exam Vitals and nursing note reviewed.  Constitutional:      General: She is not in acute distress.    Appearance: Normal appearance. She is not ill-appearing, toxic-appearing or diaphoretic.  HENT:     Head: Normocephalic.  Eyes:     Conjunctiva/sclera: Conjunctivae normal.  Pulmonary:     Effort: Pulmonary effort is normal.  Musculoskeletal:        General: Normal range of motion.     Cervical back: Normal range of motion.       Back:     Comments: Area of pain, non tender to palpation. No rash.   Skin:    General: Skin is warm and dry.     Findings: No rash.  Neurological:     Mental Status: She is alert.  Psychiatric:        Mood and Affect: Mood normal.      UC Treatments / Results  Labs (all labs ordered are listed, but only abnormal results are displayed) Labs Reviewed  POCT URINALYSIS DIP (MANUAL ENTRY) - Abnormal; Notable for the following components:      Result Value   Blood, UA trace-intact (*)    All  other components within normal limits  POCT URINE PREGNANCY    EKG   Radiology No results found.  Procedures Procedures (including critical care  time)  Medications Ordered in UC Medications  ketorolac (TORADOL) 30 MG/ML injection 30 mg (30 mg Intramuscular Given 05/16/20 0938)    Initial Impression / Assessment and Plan / UC Course  I have reviewed the triage vital signs and the nursing notes.  Pertinent labs & imaging results that were available during my care of the patient were reviewed by me and considered in my medical decision making (see chart for details).     Acute back pain Urine negative for pregnancy and infection No concerns on exam and no red flags.  Most likely muscular.  Toradol given here for pain.  Flexeril sent to the pharmacy to use as needed for muscle relaxant.  Alternate heat and ice.  Gentle stretching.  Follow up as needed for continued or worsening symptoms   Final Clinical Impressions(s) / UC Diagnoses   Final diagnoses:  Acute bilateral low back pain without sciatica     Discharge Instructions     Your urine did not show any infection Toradol given here for pain. I believe the pain to be muscular.  I am giving you a muscle relaxant to use as needed.  Continue the Ibuprofen 600 mg and tylenol 1000 mg every 8 hours as needed.  Heat to the back Gentle stretching  Follow up as needed for continued or worsening symptoms     ED Prescriptions    Medication Sig Dispense Auth. Provider   cyclobenzaprine (FLEXERIL) 5 MG tablet Take 1 tablet (5 mg total) by mouth 3 (three) times daily as needed for muscle spasms. 30 tablet Dahlia Byes A, NP     PDMP not reviewed this encounter.   Dahlia Byes A, NP 05/16/20 [phone]

## 2020-06-19 ENCOUNTER — Other Ambulatory Visit: Payer: Self-pay | Admitting: Dermatology

## 2020-10-30 ENCOUNTER — Ambulatory Visit (INDEPENDENT_AMBULATORY_CARE_PROVIDER_SITE_OTHER): Payer: No Typology Code available for payment source

## 2020-10-30 ENCOUNTER — Other Ambulatory Visit: Payer: Self-pay

## 2020-10-30 ENCOUNTER — Ambulatory Visit (HOSPITAL_COMMUNITY)
Admission: EM | Admit: 2020-10-30 | Discharge: 2020-10-30 | Disposition: A | Discharge status: Home / Self Care | Payer: No Typology Code available for payment source | Attending: Family | Admitting: Family

## 2020-10-30 ENCOUNTER — Ambulatory Visit: Payer: No Typology Code available for payment source

## 2020-10-30 DIAGNOSIS — M25512 Pain in left shoulder: Secondary | ICD-10-CM | POA: Diagnosis not present

## 2020-10-30 DIAGNOSIS — S43102A Unspecified dislocation of left acromioclavicular joint, initial encounter: Secondary | ICD-10-CM
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
## 2020-10-30 NOTE — ED Notes (Signed)
[employer]Joy not working. 

## 2020-10-30 NOTE — Discharge Instructions (Addendum)
Recommend take Aleve 1 to 2 tablets every 12 hours as needed for pain. May continue Flexeril 5mg  every 8 hours as needed for muscle pain and to help sleep. Continue to apply cool compresses to area for comfort. Wear the sling for support. Call [facility]- Dr. - today to schedule appointment for follow-up.

## 2020-10-31 ENCOUNTER — Other Ambulatory Visit: Payer: Self-pay

## 2020-10-31 MED ORDER — TRAMADOL HCL 50 MG PO TABS
ORAL_TABLET | ORAL | 0 refills | Status: DC
  Filled 2020-10-31: qty 20, 6d supply, fill #0

## 2021-01-15 ENCOUNTER — Other Ambulatory Visit: Payer: Self-pay

## 2021-01-15 ENCOUNTER — Ambulatory Visit
Admission: RE | Admit: 2021-01-15 | Discharge: 2021-01-15 | Disposition: A | Discharge status: Home / Self Care | Payer: No Typology Code available for payment source | Source: Ambulatory Visit | Attending: Emergency Medicine | Admitting: Emergency Medicine

## 2021-01-15 VITALS — BP 109/74 | HR 77 | Temp 98.9°F | Resp 20

## 2021-01-15 DIAGNOSIS — L0100 Impetigo, unspecified: Secondary | ICD-10-CM | POA: Diagnosis not present

## 2021-01-15 DIAGNOSIS — R21 Rash and other nonspecific skin eruption: Secondary | ICD-10-CM | POA: Diagnosis not present

## 2021-01-15 MED ORDER — PREDNISONE 10 MG PO TABS
ORAL_TABLET | ORAL | 0 refills | Status: AC
  Filled 2021-01-15: qty 21, 6d supply, fill #0

## 2021-01-15 MED ORDER — MUPIROCIN 2 % EX OINT
1.0000 "application " | TOPICAL_OINTMENT | Freq: Two times a day (BID) | CUTANEOUS | 0 refills | Status: AC
  Filled 2021-01-15: qty 22, 11d supply, fill #0

## 2021-01-15 NOTE — ED Triage Notes (Addendum)
Pt here with blistering rash to left face and scalp x 4 days. She did work outside last week and has had the chicken pox vaccine.

## 2021-01-15 NOTE — Discharge Instructions (Addendum)
Use mupirocin and prednisone as prescribed.  You may alternate Tylenol and ibuprofen as needed for discomfort.  Continue use of Benadryl and Pepcid as previously taking.  Return to clinic on Friday for recheck.  Return sooner for any fever, chills, worsening pain.

## 2021-01-15 NOTE — ED Provider Notes (Signed)
Chief Complaint   Chief Complaint  Patient presents with   Rash     Subjective, HPI  Emily Santos is a very pleasant 25 y.o. female who presents with blistering rash to the left side of her face and scalp for the last 4 days.  Patient reports working outside last week and states that she has had the vaccine for chickenpox.  Patient reports some drainage today.  She reports the use of Benadryl and Pepcid which seemed to help on the right side of her forehead, but states that the left side has now emerged.  Does not report any visual changes.  History obtained from patient.   Patient's problem list, past medical and social history, medications, and allergies were reviewed by me and updated in Epic.    ROS  See HPI.  Objective   Vitals:   01/15/21 0912  BP: 109/74  Pulse: 77  Resp: 20  Temp: 98.9 F (37.2 C)  SpO2: 97%    Vital signs and nursing note reviewed.  General: Appears well-developed and well-nourished. No acute distress.  HEENT: Normocephalic, atraumatic, hearing grossly intact. EOMI, no drainage. No rhinorrhea. Moist mucous membranes.  Neck: Normal range of motion, neck is supple.  Cardiovascular: Normal rate.  Pulm/Chest: No respiratory distress.   Musculoskeletal: No joint deformity, normal range of motion.  Skin: Erythematous yellow crusting rash noted to left side of forehead into hairline and to the left bridge of her nose.  Rash is mildly excoriated.  Data  No results found for any visits on 01/15/21.   Assessment & Plan  1. Impetigo - mupirocin ointment (BACTROBAN) 2 %; Apply 1 application topically 2 (two) times daily for 10 days.  Dispense: 22 g; Refill: 0  2. Rash and nonspecific skin eruption - predniSONE (DELTASONE) 10 MG tablet; Take 6 tablets (60 mg total) by mouth daily for 1 day, THEN 5 tablets (50 mg total) daily for 1 day, THEN 4 tablets (40 mg total) daily for 1 day, THEN 3 tablets (30 mg total) daily for 1 day, THEN 2 tablets (20 mg  total) daily for 1 day, THEN 1 tablet (10 mg total) daily for 1 day.  Dispense: 21 tablet; Refill: 0  25 y.o. female presents with blistering rash to the left side of her face and scalp for the last 4 days.  Patient reports working outside last week and states that she has had the vaccine for chickenpox.  Patient reports some drainage today.  She reports the use of Benadryl and Pepcid which seemed to help on the right side of her forehead, but states that the left side has now emerged.  Does not report any visual changes.  Given symptoms along with assessment findings, concern for contact dermatitis versus shingles.  Less concerned at this point for shingles as she reports that she did have the same type of rash on the right side of her forehead as well.  Patient does report walking through a spiderweb before onset of rash.  We will treat the area for impetigo with mupirocin and will also treat with a prednisone pack.  Continue use of Benadryl and Pepcid.  You may alternate Tylenol and ibuprofen as needed for discomfort.  Advised the patient to return to clinic on Friday for a recheck.  Return sooner if symptoms worsen or do not improve as expected.  Patient verbalized understanding and agreed with plan.  Patient stable upon discharge.  Plan:   Discharge Instructions  Use mupirocin and prednisone as prescribed.  You may alternate Tylenol and ibuprofen as needed for discomfort.  Continue use of Benadryl and Pepcid as previously taking.  Return to clinic on Friday for recheck.  Return sooner for any fever, chills, worsening pain.         Emily Santos, [place] 01/15/21 [phone]

## 2021-01-16 ENCOUNTER — Other Ambulatory Visit: Payer: Self-pay

## 2021-01-16 ENCOUNTER — Ambulatory Visit (INDEPENDENT_AMBULATORY_CARE_PROVIDER_SITE_OTHER): Payer: No Typology Code available for payment source | Admitting: Family Medicine

## 2021-01-16 ENCOUNTER — Encounter: Payer: Self-pay | Admitting: Family Medicine

## 2021-01-16 DIAGNOSIS — B0239 Other herpes zoster eye disease: Secondary | ICD-10-CM | POA: Diagnosis not present

## 2021-01-16 MED ORDER — VALACYCLOVIR HCL 1 G PO TABS
1000.0000 mg | ORAL_TABLET | Freq: Three times a day (TID) | ORAL | 0 refills | Status: AC
  Filled 2021-01-16: qty 21, 7d supply, fill #0

## 2021-01-16 NOTE — Progress Notes (Signed)
   Subjective:    Patient ID: Emily Santos is a 25 y.o. female presenting with No chief complaint on file.  on 01/16/2021  HPI: 4 day h/o of midline erythema concern for a spider bite. She has noted to be seen in UC and treated for impetigo yesterday. Now has sensitivity, tingling on left side and notes blisters on side of nose and around her eye.  Review of Systems  Constitutional:  Negative for chills and fever.  Respiratory:  Negative for shortness of breath.   Cardiovascular:  Negative for chest pain.  Gastrointestinal:  Negative for abdominal pain, nausea and vomiting.  Genitourinary:  Negative for dysuria.  Skin:  Negative for rash.     Objective:    LMP 01/01/2021 (Approximate)  Physical Exam Constitutional:      General: She is not in acute distress.    Appearance: She is well-developed.  HENT:     Head: Normocephalic and atraumatic.  Eyes:     General: No scleral icterus. Cardiovascular:     Rate and Rhythm: Normal rate.  Pulmonary:     Effort: Pulmonary effort is normal.  Abdominal:     Palpations: Abdomen is soft.  Musculoskeletal:     Cervical back: Neck supple.  Skin:    General: Skin is warm and dry.     Findings: Rash present. Rash is vesicular.     Comments: Multiple papules in hair line and along cheek and down the side of the nose and eye not involving the tip of the nose on the left.  Neurological:     Mental Status: She is alert and oriented to person, place, and time.        Assessment & Plan:  Shingles of eyelid - rash is most c/w with this. Should avoid work until all lesions are crusted over. - Plan: valACYclovir (VALTREX) 1000 MG tablet 1 gm TID x 7 days  Return if symptoms worsen or fail to improve.  Reva Bores 01/16/2021 4:39 PM

## 2021-03-06 ENCOUNTER — Other Ambulatory Visit: Payer: No Typology Code available for payment source

## 2021-03-06 ENCOUNTER — Other Ambulatory Visit: Payer: Self-pay

## 2021-03-06 DIAGNOSIS — Z01419 Encounter for gynecological examination (general) (routine) without abnormal findings: Secondary | ICD-10-CM

## 2021-03-07 LAB — CBC
Hematocrit: 39.8 % (ref 34.0–46.6)
Hemoglobin: 13.3 g/dL (ref 11.1–15.9)
MCH: 30.6 pg (ref 26.6–33.0)
MCHC: 33.4 g/dL (ref 31.5–35.7)
MCV: 92 fL (ref 79–97)
Platelets: 285 10*3/uL (ref 150–450)
RBC: 4.34 x10E6/uL (ref 3.77–5.28)
RDW: 11.9 % (ref 11.7–15.4)
WBC: 5.7 10*3/uL (ref 3.4–10.8)

## 2021-03-07 LAB — COMPREHENSIVE METABOLIC PANEL
ALT: 16 IU/L (ref 0–32)
AST: 18 IU/L (ref 0–40)
Albumin/Globulin Ratio: 2.1 (ref 1.2–2.2)
Albumin: 4.6 g/dL (ref 3.9–5.0)
Alkaline Phosphatase: 47 IU/L (ref 44–121)
BUN/Creatinine Ratio: 16 (ref 9–23)
BUN: 15 mg/dL (ref 6–20)
Bilirubin Total: 0.4 mg/dL (ref 0.0–1.2)
CO2: 22 mmol/L (ref 20–29)
Calcium: 9.4 mg/dL (ref 8.7–10.2)
Chloride: 108 mmol/L — ABNORMAL HIGH (ref 96–106)
Creatinine, Ser: 0.96 mg/dL (ref 0.57–1.00)
Globulin, Total: 2.2 g/dL (ref 1.5–4.5)
Glucose: 84 mg/dL (ref 70–99)
Potassium: 4.1 mmol/L (ref 3.5–5.2)
Sodium: 140 mmol/L (ref 134–144)
Total Protein: 6.8 g/dL (ref 6.0–8.5)
eGFR: 83 mL/min/{1.73_m2} (ref >59)

## 2021-03-07 LAB — HEMOGLOBIN A1C
Est. average glucose Bld gHb Est-mCnc: 91 mg/dL
Hgb A1c MFr Bld: 4.8 % (ref 4.8–5.6)

## 2021-03-07 LAB — LIPID PANEL
Chol/HDL Ratio: 2.9 ratio (ref 0.0–4.4)
Cholesterol, Total: 162 mg/dL (ref 100–199)
HDL: 55 mg/dL (ref >39)
LDL Chol Calc (NIH): 92 mg/dL (ref 0–99)
Triglycerides: 78 mg/dL (ref 0–149)
VLDL Cholesterol Cal: 15 mg/dL (ref 5–40)

## 2021-03-07 LAB — TSH: TSH: 1.19 u[IU]/mL (ref 0.450–4.500)

## 2021-03-07 LAB — VITAMIN D 25 HYDROXY (VIT D DEFICIENCY, FRACTURES): Vit D, 25-Hydroxy: 18.7 ng/mL — ABNORMAL LOW (ref 30.0–100.0)

## 2021-03-08 ENCOUNTER — Other Ambulatory Visit: Payer: Self-pay | Admitting: Family Medicine

## 2021-03-08 ENCOUNTER — Other Ambulatory Visit: Payer: Self-pay

## 2021-03-08 DIAGNOSIS — E559 Vitamin D deficiency, unspecified: Secondary | ICD-10-CM

## 2021-03-08 MED ORDER — VITAMIN D (ERGOCALCIFEROL) 1.25 MG (50000 UNIT) PO CAPS
50000.0000 [IU] | ORAL_CAPSULE | ORAL | 0 refills | Status: DC
  Filled 2021-03-08: qty 8, 56d supply, fill #0

## 2021-04-30 IMAGING — CR DG CHEST 2V
2 series · 2 of 2 positions shown · non-contrast
Comparison: June 29, 2015

CLINICAL DATA: Chest pain protocol

EXAM:
CHEST - 2 VIEW

[w chest pa]
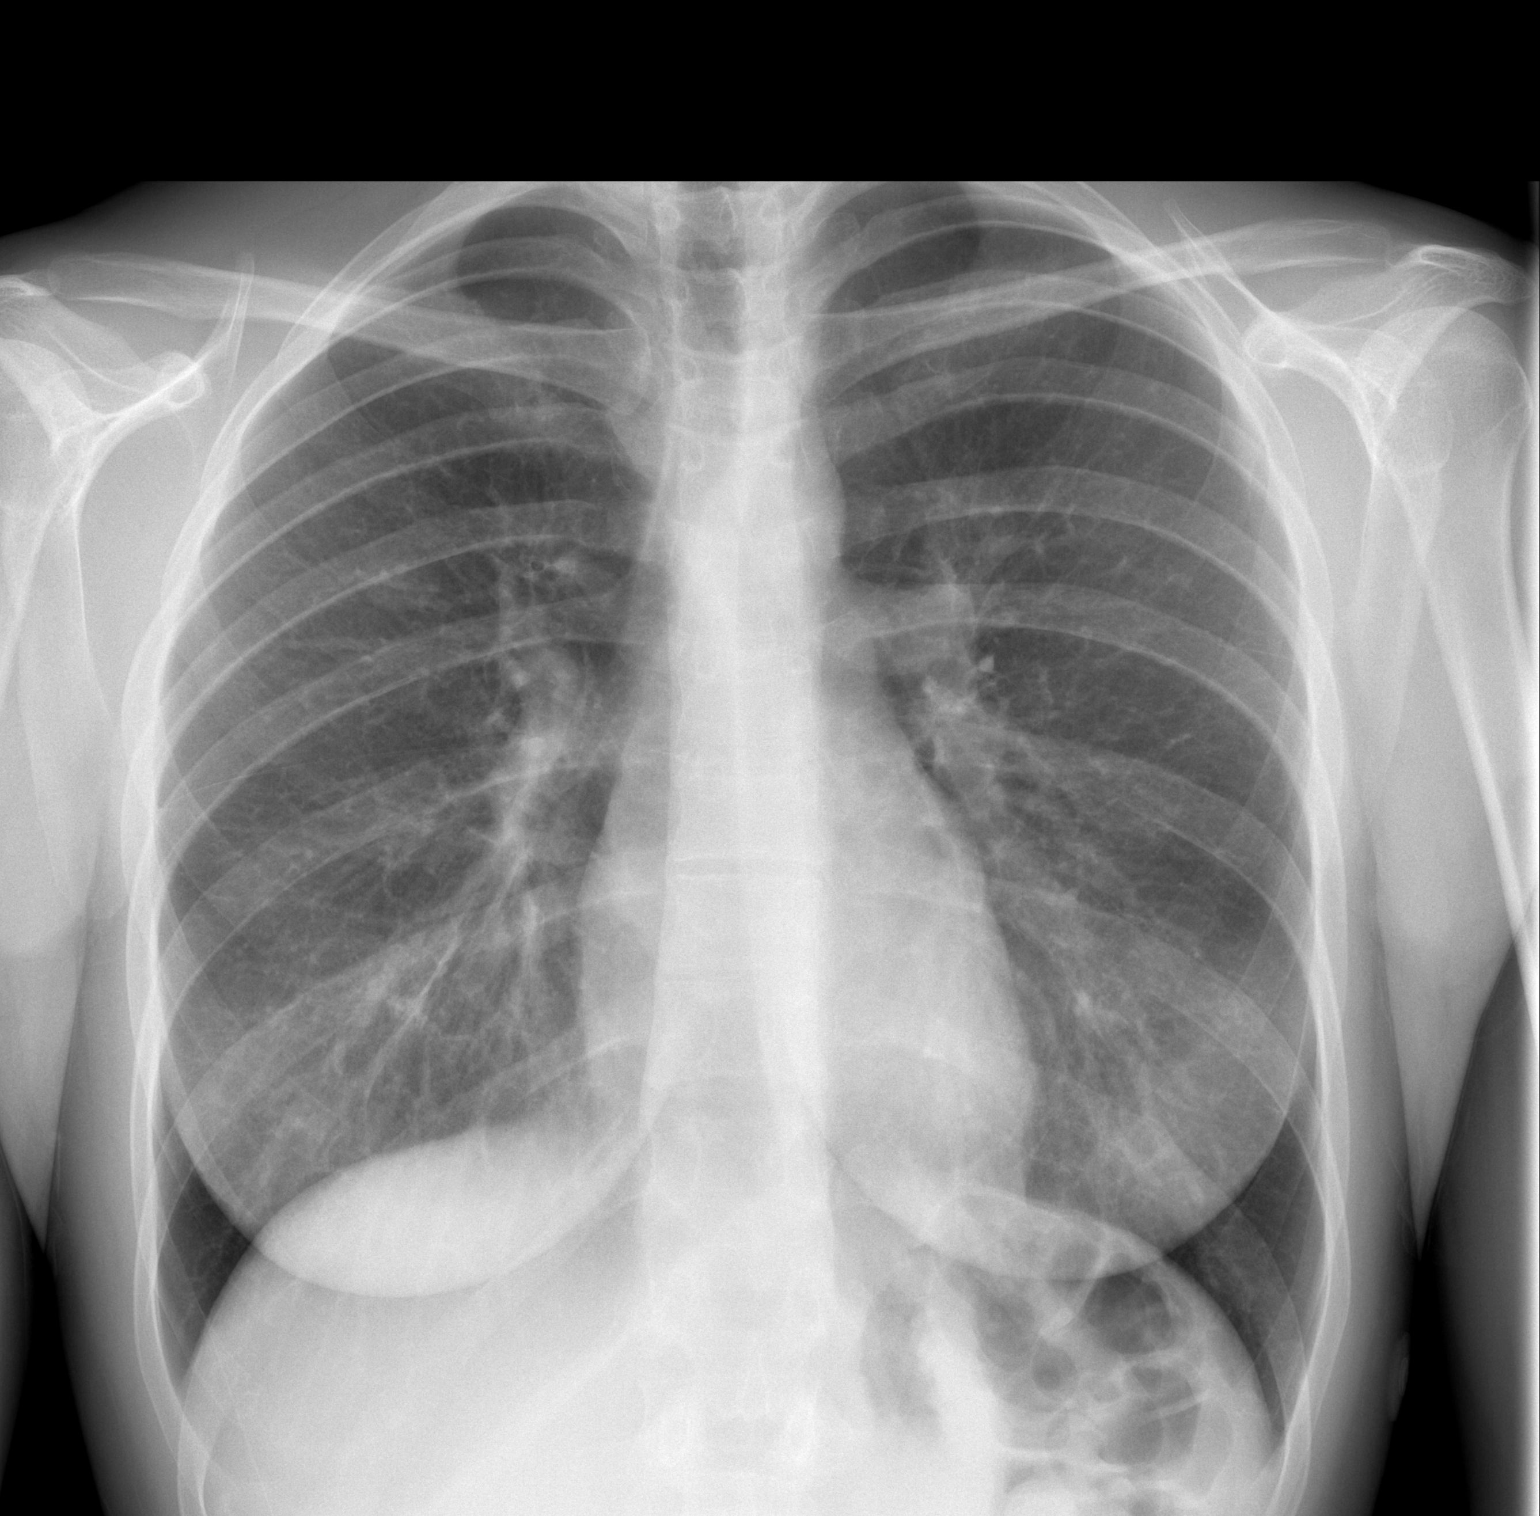

[w chest lat]
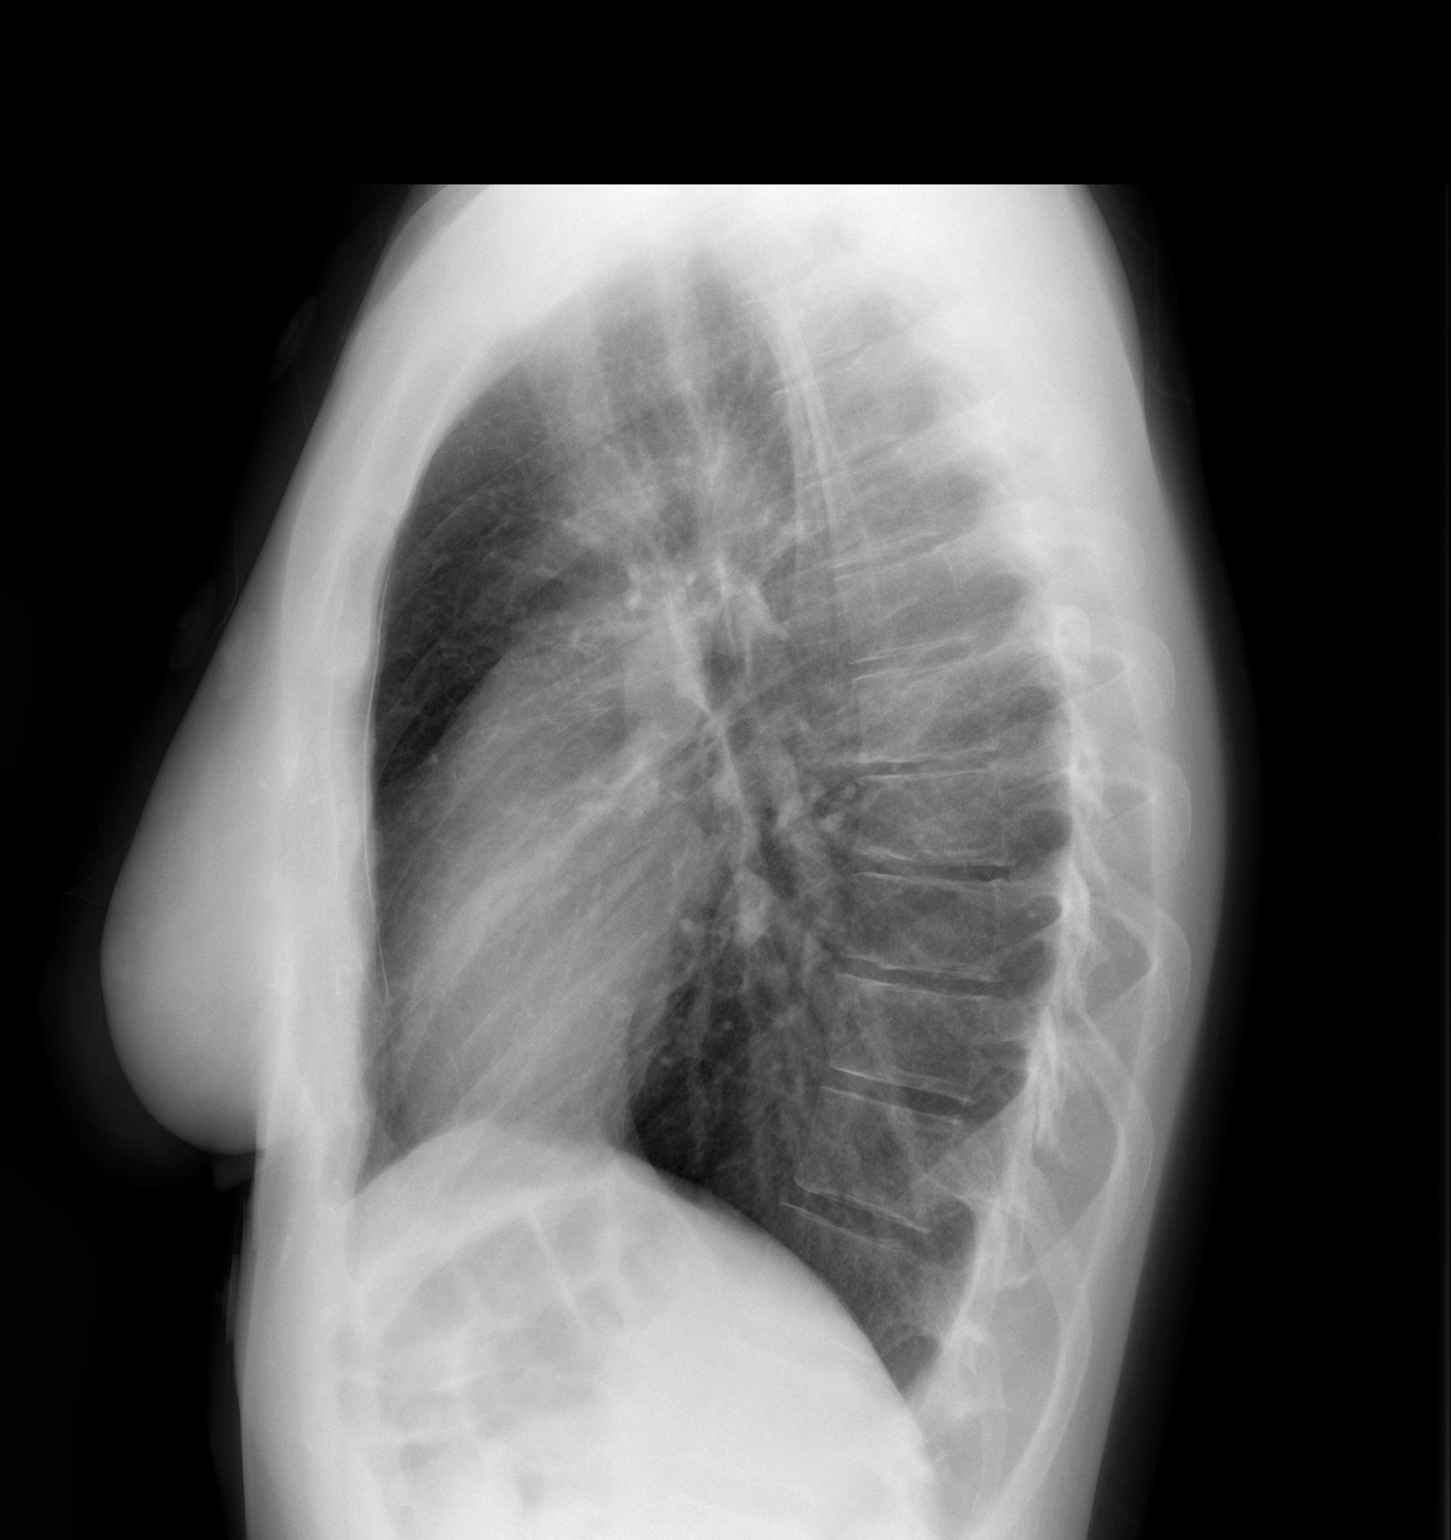

[2 of 2 positions shown; findings below may reference images not displayed]

FINDINGS: The heart size and mediastinal contours are within normal limits.
Both lungs are clear. The visualized skeletal structures are
unremarkable.
IMPRESSION: No active cardiopulmonary disease.

## 2021-06-27 ENCOUNTER — Ambulatory Visit: Payer: No Typology Code available for payment source | Admitting: Family Medicine

## 2021-07-31 ENCOUNTER — Ambulatory Visit (INDEPENDENT_AMBULATORY_CARE_PROVIDER_SITE_OTHER): Payer: No Typology Code available for payment source | Admitting: Physician Assistant

## 2021-07-31 ENCOUNTER — Other Ambulatory Visit: Payer: Self-pay

## 2021-07-31 ENCOUNTER — Encounter: Payer: Self-pay | Admitting: Physician Assistant

## 2021-07-31 VITALS — BP 107/64 | HR 61 | Ht 66.5 in | Wt 174.0 lb

## 2021-07-31 DIAGNOSIS — G43709 Chronic migraine without aura, not intractable, without status migrainosus: Secondary | ICD-10-CM

## 2021-07-31 MED ORDER — RIZATRIPTAN BENZOATE 10 MG PO TABS
10.0000 mg | ORAL_TABLET | ORAL | 11 refills | Status: DC | PRN
  Filled 2021-07-31: qty 10, 30d supply, fill #0
  Filled 2022-07-08: qty 10, 30d supply, fill #1

## 2021-07-31 MED ORDER — NURTEC 75 MG PO TBDP
75.0000 mg | ORAL_TABLET | ORAL | 11 refills | Status: DC
  Filled 2021-07-31 – 2021-08-03 (×2): qty 16, 32d supply, fill #0

## 2021-07-31 MED ORDER — TOPIRAMATE 25 MG PO TABS
75.0000 mg | ORAL_TABLET | Freq: Every day | ORAL | 3 refills | Status: DC
  Filled 2021-07-31: qty 90, 30d supply, fill #0

## 2021-07-31 NOTE — Progress Notes (Signed)
History:  ?Emily Santos is a 26 y.o. G1P1001 who presents to clinic today for headache.  She notes her headaches always get worse this time of year related to weather change and allergy.  Quality of the headaches is overall unchanged but frequency is worse.  She is having headache more days than not.   She is only using ibuprofen to treat the headache and it is not effective for those that are moderate to severe.  She is no longer using the emgality or sumatriptan that she was using when last seen by this author.  Severe headaches are lasting several days in a row.   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   ? ?Number of days in the last 4 weeks with:  ?Severe headache: 8 ?Moderate headache: 6 ?Mild headache: 4  ?No headache: 12  ? ?Past Medical History:  ?Diagnosis Date  ? Anxiety   ? Asthma   ? Depression   ? Headache   ? ? ?Social History  ? ?Socioeconomic History  ? Marital status: Single  ?  Spouse name: Not on file  ? Number of children: 1  ? Years of education: Not on file  ? Highest education level: Associate degree: academic program  ?Occupational History  ? Not on file  ?Tobacco Use  ? Smoking status: Never  ? Smokeless tobacco: Never  ?Vaping Use  ? Vaping Use: Never used  ?Substance and Sexual Activity  ? Alcohol use: Yes  ?  Alcohol/week: 4.0 standard drinks  ?  Types: 4 Cans of beer per week  ?  Comment: social events  ? Drug use: No  ? Sexual activity: Yes  ?Other Topics Concern  ? Not on file  ?Social History Narrative  ? Not on file  ? ?Social Determinants of Health  ? ?Financial Resource Strain: Not on file  ?Food Insecurity: Not on file  ?Transportation Needs: Not on file  ?Physical Activity: Not on file  ?Stress: Not on file  ?Social Connections: Not on file  ?Intimate Partner Violence: Not on file  ? ? ?Family History  ?Problem Relation Age of Onset  ? Hyperlipidemia Father   ? Hypothyroidism Father   ? Migraines Father   ? ADD / ADHD Son   ?  Hyperthyroidism Paternal Aunt   ? Hypertension Paternal Grandmother   ? ? ?Allergies  ?Allergen Reactions  ? Sulfa Antibiotics Rash  ? ? ?Current Outpatient Medications on File Prior to Visit  ?Medication Sig Dispense Refill  ? albuterol (VENTOLIN HFA) 108 (90 Base) MCG/ACT inhaler Inhale 2 puffs into the lungs every 4 (four) hours as needed. Okay to fill with albuterol/ventolin/proair 18 g 1  ? cetirizine (ZYRTEC) 10 MG tablet Take 10 mg by mouth as needed.     ? spironolactone (ALDACTONE) 50 MG tablet TAKE ONE TABLET BY MOUTH TWICE DAILY 180 tablet 3  ? SUMAtriptan (IMITREX) 100 MG tablet Take 1 tablet (100 mg total) by mouth once as needed for up to 1 dose for migraine. May repeat in 2 hours if headache persists or recurs. 9 tablet 11  ? traMADol (ULTRAM) 50 MG tablet Take One tab PO Q6 hours PRN pain 20 tablet 0  ? [DISCONTINUED] fluticasone (FLOVENT HFA) 44 MCG/ACT inhaler Inhale 2 puffs into the lungs 2 (two) times daily. 1 each 12  ? [DISCONTINUED] sucralfate (CARAFATE) 1 g tablet Take 1 tablet (1 g total) by mouth 4 (four) times daily -  with meals  and at bedtime. 60 tablet 0  ? ?No current facility-administered medications on file prior to visit.  ? ? ? ?Review of Systems:  ?All pertinent positive/negative included in HPI, all other review of systems are negative ?  ?Objective:  ?Physical Exam ?BP 107/64   Pulse 61   Ht 5' 6.5" (1.689 m)   Wt 174 lb (78.9 kg)   BMI 27.66 kg/m?  ?CONSTITUTIONAL: Well-developed, well-nourished female in no acute distress.  ?EYES: EOM intact ?ENT: Normocephalic ?CARDIOVASCULAR: Regular rate  ?RESPIRATORY: Normal rate. ?MUSCULOSKELETAL: Normal ROM ?SKIN: Warm, dry without erythema  ?NEUROLOGICAL: Alert, oriented, CN II-XII grossly intact, Appropriate balance   ?PSYCH: Normal behavior, mood ? ? ?Assessment & Plan:  ?Assessment: ?1. Chronic migraine without aura without status migrainosus, not intractable   ? ? ? ?Plan: ?Nurtec every other day for prevention and acute  treatment. ?Topamax - begin with 25mg  and titrate weekly to 75mg  as tolerated.  This should help with prevention of migraine and may help with weight loss.   ?Rizatriptan to treat breakthrough headaches.  Limit of 2/day, 2 days a week.   ?Follow-up in 3 months or sooner PRN ? ? , PA-C ?07/31/2021 ?8:12 AM ? ?

## 2021-07-31 NOTE — Patient Instructions (Signed)
Migraine Headache A migraine headache is an intense, throbbing pain on one side or both sides of the head. Migraine headaches may also cause other symptoms, such as nausea, vomiting, and sensitivity to light and noise. A migraine headache can last from 4 hours to 3 days. Talk with your doctor about what things may bring on (trigger) your migraine headaches. What are the causes? The exact cause of this condition is not known. However, a migraine may be caused when nerves in the brain become irritated and release chemicals that cause inflammation of blood vessels. This inflammation causes pain. This condition may be triggered or caused by: Drinking alcohol. Smoking. Taking medicines, such as: Medicine used to treat chest pain (nitroglycerin). Birth control pills. Estrogen. Certain blood pressure medicines. Eating or drinking products that contain nitrates, glutamate, aspartame, or tyramine. Aged cheeses, chocolate, or caffeine may also be triggers. Doing physical activity. Other things that may trigger a migraine headache include: Menstruation. Pregnancy. Hunger. Stress. Lack of sleep or too much sleep. Weather changes. Fatigue. What increases the risk? The following factors may make you more likely to experience migraine headaches: Being a certain age. This condition is more common in people who are 25-55 years old. Being female. Having a family history of migraine headaches. Being Caucasian. Having a mental health condition, such as depression or anxiety. Being obese. What are the signs or symptoms? The main symptom of this condition is pulsating or throbbing pain. This pain may: Happen in any area of the head, such as on one side or both sides. Interfere with daily activities. Get worse with physical activity. Get worse with exposure to bright lights or loud noises. Other symptoms may include: Nausea. Vomiting. Dizziness. General sensitivity to bright lights, loud noises, or  smells. Before you get a migraine headache, you may get warning signs (an aura). An aura may include: Seeing flashing lights or having blind spots. Seeing bright spots, halos, or zigzag lines. Having tunnel vision or blurred vision. Having numbness or a tingling feeling. Having trouble talking. Having muscle weakness. Some people have symptoms after a migraine headache (postdromal phase), such as: Feeling tired. Difficulty concentrating. How is this diagnosed? A migraine headache can be diagnosed based on: Your symptoms. A physical exam. Tests, such as: CT scan or an MRI of the head. These imaging tests can help rule out other causes of headaches. Taking fluid from the spine (lumbar puncture) and analyzing it (cerebrospinal fluid analysis, or CSF analysis). How is this treated? This condition may be treated with medicines that: Relieve pain. Relieve nausea. Prevent migraine headaches. Treatment for this condition may also include: Acupuncture. Lifestyle changes like avoiding foods that trigger migraine headaches. Biofeedback. Cognitive behavioral therapy. Follow these instructions at home: Medicines Take over-the-counter and prescription medicines only as told by your health care provider. Ask your health care provider if the medicine prescribed to you: Requires you to avoid driving or using heavy machinery. Can cause constipation. You may need to take these actions to prevent or treat constipation: Drink enough fluid to keep your urine pale yellow. Take over-the-counter or prescription medicines. Eat foods that are high in fiber, such as beans, whole grains, and fresh fruits and vegetables. Limit foods that are high in fat and processed sugars, such as fried or sweet foods. Lifestyle Do not drink alcohol. Do not use any products that contain nicotine or tobacco, such as cigarettes, e-cigarettes, and chewing tobacco. If you need help quitting, ask your health care  provider. Get at least 8   hours of sleep every night. Find ways to manage stress, such as meditation, deep breathing, or yoga. General instructions     Keep a journal to find out what may trigger your migraine headaches. For example, write down: What you eat and drink. How much sleep you get. Any change to your diet or medicines. If you have a migraine headache: Avoid things that make your symptoms worse, such as bright lights. It may help to lie down in a dark, quiet room. Do not drive or use heavy machinery. Ask your health care provider what activities are safe for you while you are experiencing symptoms. Keep all follow-up visits as told by your health care provider. This is important. Contact a health care provider if: You develop symptoms that are different or more severe than your usual migraine headache symptoms. You have more than 15 headache days in one month. Get help right away if: Your migraine headache becomes severe. Your migraine headache lasts longer than 72 hours. You have a fever. You have a stiff neck. You have vision loss. Your muscles feel weak or like you cannot control them. You start to lose your balance often. You have trouble walking. You faint. You have a seizure. Summary A migraine headache is an intense, throbbing pain on one side or both sides of the head. Migraines may also cause other symptoms, such as nausea, vomiting, and sensitivity to light and noise. This condition may be treated with medicines and lifestyle changes. You may also need to avoid certain things that trigger a migraine headache. Keep a journal to find out what may trigger your migraine headaches. Contact your health care provider if you have more than 15 headache days in a month or you develop symptoms that are different or more severe than your usual migraine headache symptoms. This information is not intended to replace advice given to you by your health care provider. Make sure  you discuss any questions you have with your health care provider. Document Revised: 07/17/2018 Document Reviewed: 05/07/2018 Elsevier Patient Education  2023 [employer]  

## 2021-08-03 ENCOUNTER — Other Ambulatory Visit: Payer: Self-pay

## 2021-08-08 ENCOUNTER — Other Ambulatory Visit: Payer: Self-pay

## 2021-08-08 MED ORDER — NURTEC 75 MG PO TBDP: 75.0000 mg | ORAL_TABLET | ORAL | 11 refills | Status: DC

## 2021-08-08 NOTE — Progress Notes (Signed)
Rx recently denied resending to speciality pharmacy.  ?

## 2021-08-22 ENCOUNTER — Encounter: Payer: Self-pay | Admitting: Family Medicine

## 2021-08-22 ENCOUNTER — Ambulatory Visit (INDEPENDENT_AMBULATORY_CARE_PROVIDER_SITE_OTHER): Payer: No Typology Code available for payment source | Admitting: Family Medicine

## 2021-08-22 ENCOUNTER — Other Ambulatory Visit (HOSPITAL_COMMUNITY)
Admission: RE | Admit: 2021-08-22 | Discharge: 2021-08-22 | Disposition: A | Discharge status: Home / Self Care | Payer: No Typology Code available for payment source | Source: Ambulatory Visit | Attending: Family Medicine | Admitting: Family Medicine

## 2021-08-22 VITALS — BP 110/70 | HR 75 | Ht 66.5 in | Wt 171.4 lb

## 2021-08-22 DIAGNOSIS — Z124 Encounter for screening for malignant neoplasm of cervix: Secondary | ICD-10-CM | POA: Insufficient documentation

## 2021-08-22 DIAGNOSIS — R87612 Low grade squamous intraepithelial lesion on cytologic smear of cervix (LGSIL): Secondary | ICD-10-CM | POA: Insufficient documentation

## 2021-08-22 DIAGNOSIS — Z01419 Encounter for gynecological examination (general) (routine) without abnormal findings: Secondary | ICD-10-CM

## 2021-08-22 DIAGNOSIS — E559 Vitamin D deficiency, unspecified: Secondary | ICD-10-CM

## 2021-08-22 LAB — CBC
Hematocrit: 42.1 % (ref 34.0–46.6)
Hemoglobin: 13.8 g/dL (ref 11.1–15.9)
MCH: 30.5 pg (ref 26.6–33.0)
MCHC: 32.8 g/dL (ref 31.5–35.7)
MCV: 93 fL (ref 79–97)
Platelets: 303 10*3/uL (ref 150–450)
RBC: 4.52 x10E6/uL (ref 3.77–5.28)
RDW: 11.7 % (ref 11.7–15.4)
WBC: 5.7 10*3/uL (ref 3.4–10.8)

## 2021-08-22 LAB — HEMOGLOBIN A1C
Est. average glucose Bld gHb Est-mCnc: 97 mg/dL
Hgb A1c MFr Bld: 5 % (ref 4.8–5.6)

## 2021-08-22 NOTE — Patient Instructions (Signed)
Preventive Care 21-28 Years Old, Female ?Preventive care refers to lifestyle choices and visits with your health care provider that can promote health and wellness. Preventive care visits are also called wellness exams. ?What can I expect for my preventive care visit? ?Counseling ?During your preventive care visit, your health care provider may ask about your: ?Medical history, including: ?Past medical problems. ?Family medical history. ?Pregnancy history. ?Current health, including: ?Menstrual cycle. ?Method of birth control. ?Emotional well-being. ?Home life and relationship well-being. ?Sexual activity and sexual health. ?Lifestyle, including: ?Alcohol, nicotine or tobacco, and drug use. ?Access to firearms. ?Diet, exercise, and sleep habits. ?Work and work environment. ?Sunscreen use. ?Safety issues such as seatbelt and bike helmet use. ?Physical exam ?Your health care provider may check your: ?Height and weight. These may be used to calculate your BMI (body mass index). BMI is a measurement that tells if you are at a healthy weight. ?Waist circumference. This measures the distance around your waistline. This measurement also tells if you are at a healthy weight and may help predict your risk of certain diseases, such as type 2 diabetes and high blood pressure. ?Heart rate and blood pressure. ?Body temperature. ?Skin for abnormal spots. ?What immunizations do I need? ? ?Vaccines are usually given at various ages, according to a schedule. Your health care provider will recommend vaccines for you based on your age, medical history, and lifestyle or other factors, such as travel or where you work. ?What tests do I need? ?Screening ?Your health care provider may recommend screening tests for certain conditions. This may include: ?Pelvic exam and Pap test. ?Lipid and cholesterol levels. ?Diabetes screening. This is done by checking your blood sugar (glucose) after you have not eaten for a while (fasting). ?Hepatitis  B test. ?Hepatitis C test. ?HIV (human immunodeficiency virus) test. ?STI (sexually transmitted infection) testing, if you are at risk. ?BRCA-related cancer screening. This may be done if you have a family history of breast, ovarian, tubal, or peritoneal cancers. ?Talk with your health care provider about your test results, treatment options, and if necessary, the need for more tests. ?Follow these instructions at home: ?Eating and drinking ? ?Eat a healthy diet that includes fresh fruits and vegetables, whole grains, lean protein, and low-fat dairy products. ?Take vitamin and mineral supplements as recommended by your health care provider. ?Do not drink alcohol if: ?Your health care provider tells you not to drink. ?You are pregnant, may be pregnant, or are planning to become pregnant. ?If you drink alcohol: ?Limit how much you have to 0-1 drink a day. ?Know how much alcohol is in your drink. In the [place] one drink equals one 12 oz bottle of beer (355 mL), one 5 oz glass of wine (148 mL), or one 1? oz glass of hard liquor (44 mL). ?Lifestyle ?Brush your teeth every morning and night with fluoride toothpaste. Floss one time each day. ?Exercise for at least 30 minutes 5 or more days each week. ?Do not use any products that contain nicotine or tobacco. These products include cigarettes, chewing tobacco, and vaping devices, such as e-cigarettes. If you need help quitting, ask your health care provider. ?Do not use drugs. ?If you are sexually active, practice safe sex. Use a condom or other form of protection to prevent STIs. ?If you do not wish to become pregnant, use a form of birth control. If you plan to become pregnant, see your health care provider for a prepregnancy visit. ?Find healthy ways to manage stress, such as: ?Meditation,   yoga, or listening to music. ?Journaling. ?Talking to a trusted person. ?Spending time with friends and family. ?Minimize exposure to UV radiation to reduce your risk of skin  cancer. ?Safety ?Always wear your seat belt while driving or riding in a vehicle. ?Do not drive: ?If you have been drinking alcohol. Do not ride with someone who has been drinking. ?If you have been using any mind-altering substances or drugs. ?While texting. ?When you are tired or distracted. ?Wear a helmet and other protective equipment during sports activities. ?If you have firearms in your house, make sure you follow all gun safety procedures. ?Seek help if you have been physically or sexually abused. ?What's next? ?Go to your health care provider once a year for an annual wellness visit. ?Ask your health care provider how often you should have your eyes and teeth checked. ?Stay up to date on all vaccines. ?This information is not intended to replace advice given to you by your health care provider. Make sure you discuss any questions you have with your health care provider. ?Document Revised: 09/20/2020 Document Reviewed: 09/20/2020 ?Elsevier Patient Education ? [place]. ? ?
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
## 2021-08-23 LAB — COMPREHENSIVE METABOLIC PANEL
ALT: 26 IU/L (ref 0–32)
AST: 29 IU/L (ref 0–40)
Albumin/Globulin Ratio: 1.8 (ref 1.2–2.2)
Albumin: 4.7 g/dL (ref 3.9–5.0)
Alkaline Phosphatase: 60 IU/L (ref 44–121)
BUN/Creatinine Ratio: 12 (ref 9–23)
BUN: 11 mg/dL (ref 6–20)
Bilirubin Total: 0.3 mg/dL (ref 0.0–1.2)
CO2: 22 mmol/L (ref 20–29)
Calcium: 9.6 mg/dL (ref 8.7–10.2)
Chloride: 103 mmol/L (ref 96–106)
Creatinine, Ser: 0.93 mg/dL (ref 0.57–1.00)
Globulin, Total: 2.6 g/dL (ref 1.5–4.5)
Glucose: 91 mg/dL (ref 70–99)
Potassium: 3.9 mmol/L (ref 3.5–5.2)
Sodium: 141 mmol/L (ref 134–144)
Total Protein: 7.3 g/dL (ref 6.0–8.5)
eGFR: 86 mL/min/{1.73_m2} (ref >59)

## 2021-08-23 LAB — TSH: TSH: 1.63 u[IU]/mL (ref 0.450–4.500)

## 2021-08-23 LAB — LIPID PANEL
Chol/HDL Ratio: 2.7 ratio (ref 0.0–4.4)
Cholesterol, Total: 159 mg/dL (ref 100–199)
HDL: 60 mg/dL (ref >39)
LDL Chol Calc (NIH): 86 mg/dL (ref 0–99)
Triglycerides: 63 mg/dL (ref 0–149)
VLDL Cholesterol Cal: 13 mg/dL (ref 5–40)

## 2021-08-23 LAB — VITAMIN D 25 HYDROXY (VIT D DEFICIENCY, FRACTURES): Vit D, 25-Hydroxy: 18.8 ng/mL — ABNORMAL LOW (ref 30.0–100.0)

## 2021-08-27 ENCOUNTER — Other Ambulatory Visit: Payer: Self-pay

## 2021-08-27 MED ORDER — VITAMIN D (ERGOCALCIFEROL) 1.25 MG (50000 UNIT) PO CAPS
50000.0000 [IU] | ORAL_CAPSULE | ORAL | 0 refills | Status: AC
  Filled 2021-08-27: qty 8, 56d supply, fill #0

## 2021-08-27 NOTE — Addendum Note (Signed)
Addended by: Donnamae Jude on: 08/27/2021 08:10 AM   Modules accepted: Orders

## 2021-08-29 ENCOUNTER — Other Ambulatory Visit: Payer: Self-pay

## 2021-08-31 LAB — CYTOLOGY - PAP: Adequacy: ABSENT

## 2021-09-01 ENCOUNTER — Encounter: Payer: Self-pay | Admitting: Family Medicine

## 2021-10-26 ENCOUNTER — Encounter: Payer: Self-pay | Admitting: Physician Assistant

## 2021-10-26 ENCOUNTER — Ambulatory Visit (INDEPENDENT_AMBULATORY_CARE_PROVIDER_SITE_OTHER): Payer: No Typology Code available for payment source | Admitting: Physician Assistant

## 2021-10-26 VITALS — BP 99/68 | HR 96 | Wt 167.0 lb

## 2021-10-26 DIAGNOSIS — G43009 Migraine without aura, not intractable, without status migrainosus: Secondary | ICD-10-CM

## 2021-10-26 NOTE — Progress Notes (Signed)
Patient here to discuss HA management.   Pt states HA's have gotten better. Yet notes a few Migraines still.   History:  Emily Santos is a 26 y.o. G1P1001 who presents to clinic today for headache followup.  Only using 2 tabs of topiramate on a daily basis but she has seen improvement.  She is also using nurtec with good response.   Rizatriptan helps take acute migraine away but causes sleepiness.  She uses ibuprofen first line. She continues to work 3rd shift and then when she is not working, she sleeps at night.  Schedule is constantly shifting. Using CBD gummies for mental health and this is working well for her = also oral oil.   She cut out energy drinks and most caffeine.  She is using cordyceps mushroom oil for energy.    HIT6:55 Number of days in the last 4 weeks with:  Severe headache: 3 Moderate headache: 2 Mild headache: 5  No headache: 18   Past Medical History:  Diagnosis Date   Anxiety    Asthma    Depression    Headache     Social History   Socioeconomic History   Marital status: Single    Spouse name: Not on file   Number of children: 1   Years of education: Not on file   Highest education level: Associate degree: academic program  Occupational History   Not on file  Tobacco Use   Smoking status: Never   Smokeless tobacco: Never  Vaping Use   Vaping Use: Never used  Substance and Sexual Activity   Alcohol use: Yes    Alcohol/week: 4.0 standard drinks of alcohol    Types: 4 Cans of beer per week    Comment: social events   Drug use: No   Sexual activity: Yes    Birth control/protection: I.U.D.  Other Topics Concern   Not on file  Social History Narrative   Not on file   Social Determinants of Health   Financial Resource Strain: Not on file  Food Insecurity: Not on file  Transportation Needs: Not on file  Physical Activity: Not on file  Stress: Not on file  Social Connections: Not on file  Intimate Partner Violence: Not on file     Family History  Problem Relation Age of Onset   Hyperlipidemia Father    Hypothyroidism Father    Migraines Father    ADD / ADHD Son    Hyperthyroidism Paternal Aunt    Hypertension Paternal Grandmother     Allergies  Allergen Reactions   Sulfa Antibiotics Rash    Current Outpatient Medications on File Prior to Visit  Medication Sig Dispense Refill   albuterol (VENTOLIN HFA) 108 (90 Base) MCG/ACT inhaler Inhale 2 puffs into the lungs every 4 (four) hours as needed. Okay to fill with albuterol/ventolin/proair 18 g 1   cetirizine (ZYRTEC) 10 MG tablet Take 10 mg by mouth as needed.      fluticasone (FLOVENT HFA) 44 MCG/ACT inhaler Flovent HFA 44 mcg/actuation aerosol inhaler     Rimegepant Sulfate (NURTEC) 75 MG TBDP Take 75 mg by mouth every other day. 16 tablet 11   rizatriptan (MAXALT) 10 MG tablet Take 1 tablet (10 mg total) by mouth as needed for migraine. May repeat in 2 hours if needed 10 tablet 11   topiramate (TOPAMAX) 25 MG tablet Take 3 tablets (75 mg total) by mouth daily. 90 tablet 3   No current facility-administered medications on file prior to visit.  Review of Systems:  All pertinent positive/negative included in HPI, all other review of systems are negative   Objective:  Physical Exam BP 99/68   Pulse 96   Wt 167 lb (75.8 kg)   BMI 26.55 kg/m  CONSTITUTIONAL: Well-developed, well-nourished female in no acute distress.  EYES: EOM intact ENT: Normocephalic CARDIOVASCULAR: Regular rate and rhythm with no adventitious sounds.  RESPIRATORY: Normal rate.   MUSCULOSKELETAL: Normal ROM, SKIN: Warm, dry without erythema  NEUROLOGICAL: Alert, oriented, CN II-XII grossly intact, Appropriate balance PSYCH: Normal behavior, mood   Assessment & Plan:  Assessment: 1. Migraine without aura, not intractable, without status migrainosus      Plan: Increase to 3 topamax daily for migraine prevention Regular use of nurtec every other day for  prevention/acute Continue rizatriptan for breakthrough headaches when needed - ibuprofen first Regulate schedule as able Follow-up in 6 months or sooner PRN  Bertram Denver, PA-C 10/26/2021 11:03 AM

## 2021-10-31 ENCOUNTER — Other Ambulatory Visit: Payer: Self-pay | Admitting: Family Medicine

## 2021-10-31 ENCOUNTER — Encounter: Payer: Self-pay | Admitting: Family Medicine

## 2021-10-31 ENCOUNTER — Ambulatory Visit (INDEPENDENT_AMBULATORY_CARE_PROVIDER_SITE_OTHER): Payer: No Typology Code available for payment source | Admitting: Family Medicine

## 2021-10-31 VITALS — BP 130/75 | HR 111

## 2021-10-31 DIAGNOSIS — R87810 Cervical high risk human papillomavirus (HPV) DNA test positive: Secondary | ICD-10-CM

## 2021-10-31 DIAGNOSIS — Z01812 Encounter for preprocedural laboratory examination: Secondary | ICD-10-CM | POA: Diagnosis not present

## 2021-10-31 DIAGNOSIS — R87612 Low grade squamous intraepithelial lesion on cytologic smear of cervix (LGSIL): Secondary | ICD-10-CM

## 2021-10-31 DIAGNOSIS — R87619 Unspecified abnormal cytological findings in specimens from cervix uteri: Secondary | ICD-10-CM

## 2021-10-31 LAB — POCT URINE PREGNANCY: Preg Test, Ur: NEGATIVE

## 2021-10-31 NOTE — Progress Notes (Signed)
Patient presents for Colpo.  Abnormal pap on 08/22/21.

## 2021-10-31 NOTE — Progress Notes (Signed)
     GYNECOLOGY OFFICE COLPOSCOPY PROCEDURE NOTE  26 y.o. G1P1001 here for colposcopy for low-grade squamous intraepithelial neoplasia (LGSIL - encompassing HPV,mild dysplasia,CIN I) pap smear on 08/22/2021. Discussed role for HPV in cervical dysplasia, need for surveillance.  Patient gave informed written consent, time out was performed.  Placed in lithotomy position. Cervix viewed with speculum and colposcope after application of acetic acid.   Colposcopy adequate? Yes  acetowhite lesion(s) noted at superior and posterior SCJ. Biopsies obtained at 12, 6 and 3 o'clock.  ECC specimen obtained. All specimens were labeled and sent to pathology.  Chaperone was present during entire procedure.  Patient was given post procedure instructions.  Will follow up pathology and manage accordingly; patient will be contacted with results and recommendations.  Routine preventative health maintenance measures emphasized.   Reva Bores 10/31/2021 2:43 PM

## 2021-11-07 ENCOUNTER — Other Ambulatory Visit: Payer: Self-pay

## 2021-11-07 DIAGNOSIS — N898 Other specified noninflammatory disorders of vagina: Secondary | ICD-10-CM

## 2021-11-07 MED ORDER — METRONIDAZOLE 500 MG PO TABS
500.0000 mg | ORAL_TABLET | Freq: Two times a day (BID) | ORAL | 0 refills | Status: DC
  Filled 2021-11-07: qty 14, 7d supply, fill #0

## 2021-11-07 NOTE — Progress Notes (Signed)
Rx sent per protocol for pt w/ sx's of BV.

## 2021-11-09 ENCOUNTER — Encounter: Payer: No Typology Code available for payment source | Admitting: Physician Assistant
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
## 2022-01-09 ENCOUNTER — Encounter: Payer: No Typology Code available for payment source | Admitting: Family

## 2022-04-12 ENCOUNTER — Other Ambulatory Visit: Payer: Self-pay

## 2022-04-12 ENCOUNTER — Encounter: Payer: Self-pay | Admitting: Physician Assistant

## 2022-04-12 ENCOUNTER — Ambulatory Visit (INDEPENDENT_AMBULATORY_CARE_PROVIDER_SITE_OTHER): Payer: 59 | Admitting: Physician Assistant

## 2022-04-12 VITALS — BP 119/71 | HR 80 | Ht 66.0 in | Wt 176.0 lb

## 2022-04-12 DIAGNOSIS — G43009 Migraine without aura, not intractable, without status migrainosus: Secondary | ICD-10-CM

## 2022-04-12 MED ORDER — AIMOVIG 70 MG/ML ~~LOC~~ SOAJ
70.0000 mg | SUBCUTANEOUS | 11 refills | Status: DC
  Filled 2022-04-12 – 2022-04-30 (×3): qty 1, 30d supply, fill #0
  Filled 2022-05-29: qty 1, 30d supply, fill #1
  Filled 2022-07-08: qty 1, 30d supply, fill #2
  Filled 2022-08-07: qty 1, 30d supply, fill #3
  Filled 2022-09-09: qty 1, 30d supply, fill #4
  Filled 2022-10-18: qty 1, 30d supply, fill #5

## 2022-04-12 MED ORDER — UBRELVY 50 MG PO TABS
50.0000 mg | ORAL_TABLET | ORAL | 11 refills | Status: DC | PRN
  Filled 2022-04-12 – 2022-04-25 (×2): qty 10, 30d supply, fill #0
  Filled 2022-05-29: qty 10, 30d supply, fill #1
  Filled 2022-07-08: qty 10, 30d supply, fill #2
  Filled 2022-08-07: qty 10, 30d supply, fill #3
  Filled 2022-09-09: qty 10, 30d supply, fill #4
  Filled 2022-10-18: qty 10, 30d supply, fill #5

## 2022-04-12 NOTE — Progress Notes (Signed)
History:  Emily Santos is a 27 y.o. G1P1001 who presents to clinic today for headache.  She is interested in restarting a monthly injection.  She is not able to use Nurtec any more after vomiting it up.  Now she cannot stand the flavor.   She also vomited the Topamax and then discontinued entirely.  This was maybe 4 months ago.  She notes the headaches did increase following this discontinuation.   She previously used Emgality and switched after it started lasting shorter duration (after 4-5 injections).   She is still using CBD gummies and discontinued Corticeps mushrooms.  No other medications for mental health.  She is still working nights at the hospital and having a changing sleep schedule.  HIT6:56 Number of days in the last 4 weeks with:  Severe headache: 3 Moderate headache: 8 Mild headache: 6  No headache: 11   Past Medical History:  Diagnosis Date   Anxiety    Asthma    Depression    Headache     Social History   Socioeconomic History   Marital status: Single    Spouse name: Not on file   Number of children: 1   Years of education: Not on file   Highest education level: Associate degree: academic program  Occupational History   Not on file  Tobacco Use   Smoking status: Never   Smokeless tobacco: Never  Vaping Use   Vaping Use: Never used  Substance and Sexual Activity   Alcohol use: Yes    Alcohol/week: 4.0 standard drinks of alcohol    Types: 4 Cans of beer per week    Comment: social events   Drug use: No   Sexual activity: Yes    Birth control/protection: I.U.D.  Other Topics Concern   Not on file  Social History Narrative   Not on file   Social Determinants of Health   Financial Resource Strain: Not on file  Food Insecurity: Not on file  Transportation Needs: Not on file  Physical Activity: Not on file  Stress: Not on file  Social Connections: Not on file  Intimate Partner Violence: Not on file    Family History  Problem Relation Age of  Onset   Hyperlipidemia Father    Hypothyroidism Father    Migraines Father    ADD / ADHD Son    Hyperthyroidism Paternal Aunt    Hypertension Paternal Grandmother     Allergies  Allergen Reactions   Sulfa Antibiotics Rash    Current Outpatient Medications on File Prior to Visit  Medication Sig Dispense Refill   albuterol (VENTOLIN HFA) 108 (90 Base) MCG/ACT inhaler Inhale 2 puffs into the lungs every 4 (four) hours as needed. Okay to fill with albuterol/ventolin/proair (Patient not taking: Reported on 04/12/2022) 18 g 1   cetirizine (ZYRTEC) 10 MG tablet Take 10 mg by mouth as needed.  (Patient not taking: Reported on 04/12/2022)     fluticasone (FLOVENT HFA) 44 MCG/ACT inhaler Flovent HFA 44 mcg/actuation aerosol inhaler (Patient not taking: Reported on 04/12/2022)     metroNIDAZOLE (FLAGYL) 500 MG tablet Take 1 tablet (500 mg total) by mouth 2 (two) times daily. (Patient not taking: Reported on 04/12/2022) 14 tablet 0   Rimegepant Sulfate (NURTEC) 75 MG TBDP Take 75 mg by mouth every other day. (Patient not taking: Reported on 04/12/2022) 16 tablet 11   rizatriptan (MAXALT) 10 MG tablet Take 1 tablet (10 mg total) by mouth as needed for migraine. May repeat in 2  hours if needed (Patient not taking: Reported on 04/12/2022) 10 tablet 11   topiramate (TOPAMAX) 25 MG tablet Take 3 tablets (75 mg total) by mouth daily. (Patient not taking: Reported on 04/12/2022) 90 tablet 3   No current facility-administered medications on file prior to visit.     Review of Systems:  All pertinent positive/negative included in HPI, all other review of systems are negative   Objective:  Physical Exam BP 119/71   Pulse 80   Ht 5\' 6"  (1.676 m)   Wt 176 lb (79.8 kg)   BMI 28.41 kg/m  CONSTITUTIONAL: Well-developed, well-nourished female in no acute distress.  EYES: EOM intact ENT: Normocephalic CARDIOVASCULAR: Regular rate  RESPIRATORY: Normal rate.  MUSCULOSKELETAL: Normal ROM SKIN: Warm, dry without  erythema  NEUROLOGICAL: Alert, oriented, CN II-XII grossly intact, Appropriate balance PSYCH: Normal behavior, mood   Assessment & Plan:  Assessment: 1. Migraine without aura, not intractable, without status migrainosus      Plan: Begin Aimovig monthly for prevention of migraine.  Zavzpret sample given today to help with current acute migraine.  Pt can let me know if she desires rx for this.   Roselyn Meier prescribed for acute treatment of migraine.  Samples given.  Pt advised it may not work as well with recent administration of Aimovig.   Rizatriptan for acute migraine otherwise.  Regular schedule encouraged.   May need additional prevention but will give Aimovig a chance to work first.   Follow-up in 6 months or sooner PRN  31 minutes spent face to face with patient.  Paticia Stack, PA-C 04/12/2022 9:04 AM

## 2022-04-15 ENCOUNTER — Other Ambulatory Visit: Payer: Self-pay

## 2022-04-25 ENCOUNTER — Other Ambulatory Visit: Payer: Self-pay

## 2022-04-26 ENCOUNTER — Other Ambulatory Visit: Payer: Self-pay

## 2022-04-30 ENCOUNTER — Other Ambulatory Visit: Payer: Self-pay

## 2022-05-01 ENCOUNTER — Other Ambulatory Visit: Payer: Self-pay

## 2022-05-29 ENCOUNTER — Other Ambulatory Visit: Payer: Self-pay

## 2022-06-07 ENCOUNTER — Other Ambulatory Visit: Payer: Self-pay

## 2022-08-07 ENCOUNTER — Other Ambulatory Visit: Payer: Self-pay

## 2022-08-13 ENCOUNTER — Other Ambulatory Visit: Payer: Self-pay

## 2022-08-14 ENCOUNTER — Encounter: Payer: Self-pay | Admitting: Family Medicine

## 2022-08-14 ENCOUNTER — Other Ambulatory Visit (HOSPITAL_COMMUNITY)
Admission: RE | Admit: 2022-08-14 | Discharge: 2022-08-14 | Disposition: A | Discharge status: Home / Self Care | Payer: 59 | Source: Ambulatory Visit | Attending: Family Medicine | Admitting: Family Medicine

## 2022-08-14 ENCOUNTER — Ambulatory Visit (INDEPENDENT_AMBULATORY_CARE_PROVIDER_SITE_OTHER): Payer: 59 | Admitting: Family Medicine

## 2022-08-14 VITALS — BP 117/72 | HR 60 | Ht 66.5 in | Wt 184.0 lb

## 2022-08-14 DIAGNOSIS — R87612 Low grade squamous intraepithelial lesion on cytologic smear of cervix (LGSIL): Secondary | ICD-10-CM | POA: Insufficient documentation

## 2022-08-14 DIAGNOSIS — Z01419 Encounter for gynecological examination (general) (routine) without abnormal findings: Secondary | ICD-10-CM

## 2022-08-14 DIAGNOSIS — E559 Vitamin D deficiency, unspecified: Secondary | ICD-10-CM

## 2022-08-14 DIAGNOSIS — J4540 Moderate persistent asthma, uncomplicated: Secondary | ICD-10-CM

## 2022-08-14 MED ORDER — ALBUTEROL SULFATE HFA 108 (90 BASE) MCG/ACT IN AERS: 2.0000 | INHALATION_SPRAY | RESPIRATORY_TRACT | 1 refills | Status: DC | PRN

## 2022-08-14 NOTE — Assessment & Plan Note (Signed)
Repeat pap today.  

## 2022-08-14 NOTE — Progress Notes (Signed)
Patient presents for Annual.  LMP: NA as has IUD   Last pap: Date: 2023 Contraception: IUD: Mirena  Mammogram: Not yet indicated STD Screening:  Flu Vaccine : N/A  CC:  Annual with labs

## 2022-08-14 NOTE — Patient Instructions (Signed)

## 2022-08-14 NOTE — Progress Notes (Addendum)
Subjective:     Emily Santos is a 27 y.o. female and is here for a comprehensive physical exam. The patient reports no problems.      The following portions of the patient's history were reviewed and updated as appropriate: allergies, current medications, past family history, past medical history, past social history, past surgical history, and problem list.  Review of Systems Pertinent items noted in HPI and remainder of comprehensive ROS otherwise negative.   Objective:  Chaperone present for exam   BP 117/72   Pulse 60   Ht 5' 6.5" (1.689 m)   Wt 184 lb (83.5 kg)   BMI 29.25 kg/m  General appearance: alert, cooperative, and appears stated age Head: Normocephalic, without obvious abnormality, atraumatic Neck: no adenopathy, supple, symmetrical, trachea midline, and thyroid not enlarged, symmetric, no tenderness/mass/nodules Lungs: clear to auscultation bilaterally Breasts: normal appearance, no masses or tenderness Heart: regular rate and rhythm, S1, S2 normal, no murmur, click, rub or gallop Abdomen: soft, non-tender; bowel sounds normal; no masses,  no organomegaly Pelvic: cervix normal in appearance, external genitalia normal, no adnexal masses or tenderness, no cervical motion tenderness, uterus normal size, shape, and consistency, and vagina normal without discharge Extremities: extremities normal, atraumatic, no cyanosis or edema Pulses: 2+ and symmetric Skin: Skin color, texture, turgor normal. No rashes or lesions Lymph nodes: Cervical, supraclavicular, and axillary nodes normal. Neurologic: Grossly normal    Assessment:    Healthy female exam.      Plan:   Problem List Items Addressed This Visit       Unprioritized   LGSIL on Pap smear of cervix    Repeat pap today      Relevant Orders   Cytology - PAP( [place])   Moderate persistent asthma without complication    Has inhaler--will discuss with PCP about inhaled steroid use if using inhaler more  than 2x/week      Relevant Medications   albuterol (VENTOLIN HFA) 108 (90 Base) MCG/ACT inhaler   Other Visit Diagnoses     Encounter for gynecological examination without abnormal finding    -  Primary   Relevant Orders   CBC   Hemoglobin A1c   TSH   Comprehensive metabolic panel   Lipid panel   VITAMIN D 25 Hydroxy (Vit-D Deficiency, Fractures)      Return in 1 year (on 08/14/2023).    See After Visit Summary for Counseling Recommendations

## 2022-08-14 NOTE — Assessment & Plan Note (Signed)
Has inhaler--will discuss with PCP about inhaled steroid use if using inhaler more than 2x/week

## 2022-08-15 ENCOUNTER — Other Ambulatory Visit: Payer: Self-pay

## 2022-08-15 LAB — LIPID PANEL
Chol/HDL Ratio: 2.9 ratio (ref 0.0–4.4)
Cholesterol, Total: 169 mg/dL (ref 100–199)
HDL: 58 mg/dL (ref >39)
LDL Chol Calc (NIH): 96 mg/dL (ref 0–99)
Triglycerides: 79 mg/dL (ref 0–149)
VLDL Cholesterol Cal: 15 mg/dL (ref 5–40)

## 2022-08-15 LAB — COMPREHENSIVE METABOLIC PANEL
ALT: 21 IU/L (ref 0–32)
AST: 24 IU/L (ref 0–40)
Albumin/Globulin Ratio: 1.8 (ref 1.2–2.2)
Albumin: 4.4 g/dL (ref 4.0–5.0)
Alkaline Phosphatase: 55 IU/L (ref 44–121)
BUN/Creatinine Ratio: 15 (ref 9–23)
BUN: 13 mg/dL (ref 6–20)
Bilirubin Total: 0.2 mg/dL (ref 0.0–1.2)
CO2: 20 mmol/L (ref 20–29)
Calcium: 9.6 mg/dL (ref 8.7–10.2)
Chloride: 105 mmol/L (ref 96–106)
Creatinine, Ser: 0.89 mg/dL (ref 0.57–1.00)
Globulin, Total: 2.4 g/dL (ref 1.5–4.5)
Glucose: 88 mg/dL (ref 70–99)
Potassium: 4.2 mmol/L (ref 3.5–5.2)
Sodium: 142 mmol/L (ref 134–144)
Total Protein: 6.8 g/dL (ref 6.0–8.5)
eGFR: 91 mL/min/{1.73_m2} (ref >59)

## 2022-08-15 LAB — CBC
Hematocrit: 39 % (ref 34.0–46.6)
Hemoglobin: 13 g/dL (ref 11.1–15.9)
MCH: 30.8 pg (ref 26.6–33.0)
MCHC: 33.3 g/dL (ref 31.5–35.7)
MCV: 92 fL (ref 79–97)
Platelets: 313 10*3/uL (ref 150–450)
RBC: 4.22 x10E6/uL (ref 3.77–5.28)
RDW: 11.8 % (ref 11.7–15.4)
WBC: 8.2 10*3/uL (ref 3.4–10.8)

## 2022-08-15 LAB — TSH: TSH: 2.99 u[IU]/mL (ref 0.450–4.500)

## 2022-08-15 LAB — HEMOGLOBIN A1C
Est. average glucose Bld gHb Est-mCnc: 97 mg/dL
Hgb A1c MFr Bld: 5 % (ref 4.8–5.6)

## 2022-08-15 LAB — VITAMIN D 25 HYDROXY (VIT D DEFICIENCY, FRACTURES): Vit D, 25-Hydroxy: 18.9 ng/mL — ABNORMAL LOW (ref 30.0–100.0)

## 2022-08-15 MED ORDER — VITAMIN D (ERGOCALCIFEROL) 1.25 MG (50000 UNIT) PO CAPS
50000.0000 [IU] | ORAL_CAPSULE | ORAL | 0 refills | Status: AC
  Filled 2022-08-15 – 2022-09-09 (×2): qty 8, 56d supply, fill #0

## 2022-08-15 NOTE — Addendum Note (Signed)
Addended by: Reva Bores on: 08/15/2022 08:08 AM   Modules accepted: Orders

## 2022-08-17 LAB — CYTOLOGY - PAP
Comment: NEGATIVE
Diagnosis: UNDETERMINED — AB
High risk HPV: NEGATIVE

## 2022-08-27 ENCOUNTER — Other Ambulatory Visit: Payer: Self-pay

## 2022-09-09 ENCOUNTER — Other Ambulatory Visit: Payer: Self-pay

## 2022-10-18 ENCOUNTER — Other Ambulatory Visit: Payer: Self-pay

## 2023-05-07 ENCOUNTER — Ambulatory Visit
Admission: EM | Admit: 2023-05-07 | Discharge: 2023-05-07 | Disposition: A | Discharge status: Home / Self Care | Payer: Commercial Managed Care - PPO | Attending: Emergency Medicine | Admitting: Emergency Medicine

## 2023-05-07 ENCOUNTER — Other Ambulatory Visit: Payer: Self-pay

## 2023-05-07 DIAGNOSIS — B349 Viral infection, unspecified: Secondary | ICD-10-CM

## 2023-05-07 LAB — POC COVID19/FLU A&B COMBO
Covid Antigen, POC: NEGATIVE
Influenza A Antigen, POC: NEGATIVE
Influenza B Antigen, POC: NEGATIVE

## 2023-05-07 MED ORDER — ALBUTEROL SULFATE HFA 108 (90 BASE) MCG/ACT IN AERS
1.0000 | INHALATION_SPRAY | Freq: Four times a day (QID) | RESPIRATORY_TRACT | 0 refills | Status: AC | PRN
  Filled 2023-05-07: qty 6.7, 25d supply, fill #0

## 2023-05-07 NOTE — ED Provider Notes (Addendum)
[name]    CSN: [id number] Arrival date & time: 05/07/23  1618      History   Chief Complaint Chief Complaint  Patient presents with   Fever    HPI Emily Santos is a 28 y.o. female.  Patient presents with fever, body aches, congestion, cough since today.  Tmax 100.6.  No OTC medications taken.  No wheezing or shortness of breath.  Her medical history includes asthma.  She does not currently have an albuterol inhaler.  The history is provided by the patient and medical records.    Past Medical History:  Diagnosis Date   Anxiety    Asthma    Depression    Headache     Patient Active Problem List   Diagnosis Date Noted   Chronic migraine without aura without status migrainosus, not intractable 07/31/2021   Moderate persistent asthma without complication 11/13/2019   Bipolar 2 disorder (HCC) 09/08/2019   Generalized anxiety disorder 06/02/2019   MDD (major depressive disorder), recurrent, in full remission (HCC) 06/02/2019   LGSIL on Pap smear of cervix 06/02/2019   Wheeze 01/28/2019   Anxiety and depression 12/16/2018   Migraine without aura, not intractable, without status migrainosus 07/24/2018   Orbital pain, left 01/31/2016   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 Authorizing Provider  albuterol (VENTOLIN HFA) 108 (90 Base) MCG/ACT inhaler Inhale 1-2 puffs into the lungs every 6 (six) hours as needed. 05/07/23  Yes Mickie Bail, NP  Erenumab-aooe (AIMOVIG) 70 MG/ML SOAJ Inject 70 mg into the skin every 30 (thirty) days. Patient not taking: Reported on  05/07/2023 04/12/22   Glyn Ade, Scot Jun, PA-C  Ubrogepant (UBRELVY) 50 MG TABS Take 50 mg by mouth as needed. Patient not taking: Reported on 05/07/2023 04/12/22   Glyn Ade, Scot Jun, PA-C    Family History Family History  Problem Relation Age of Onset   Hyperlipidemia Father    Hypothyroidism Father    Migraines Father    ADD / ADHD Son    Hyperthyroidism Paternal Aunt    Hypertension Paternal Grandmother     Social History Social History   Tobacco Use   Smoking status: Never   Smokeless tobacco: Never  Vaping Use   Vaping status: Never Used  Substance Use Topics   Alcohol use: Yes    Alcohol/week: 4.0 standard drinks of alcohol    Types: 4 Cans of beer per week    Comment: social events   Drug use: No     Allergies   Sulfa antibiotics   Review of Systems Review of Systems  Constitutional:  Positive for fever. Negative for chills.  HENT:  Positive for congestion. Negative for ear pain and sore throat.   Respiratory:  Positive for cough. Negative for shortness of  breath and wheezing.      Physical Exam Triage Vital Signs ED Triage Vitals [05/07/23 1630]  Encounter Vitals Group     BP      Systolic BP Percentile      Diastolic BP Percentile      Pulse      Resp      Temp      Temp src      SpO2      Weight      Height      Head Circumference      Peak Flow      Pain Score 2     Pain Loc      Pain Education      Exclude from Growth Chart    No data found.  Updated Vital Signs BP 103/65   Pulse 100   Temp 99.2 F (37.3 C)   Resp 18   SpO2 97%   Visual Acuity Right Eye Distance:   Left Eye Distance:   Bilateral Distance:    Right Eye Near:   Left Eye Near:    Bilateral Near:     Physical Exam Constitutional:      General: She is not in acute distress. HENT:     Right Ear: Tympanic membrane normal.     Left Ear: Tympanic membrane normal.     Nose: Nose normal.     Mouth/Throat:     Mouth: Mucous membranes are moist.      Pharynx: Oropharynx is clear.  Cardiovascular:     Rate and Rhythm: Normal rate and regular rhythm.     Heart sounds: Normal heart sounds.  Pulmonary:     Effort: Pulmonary effort is normal. No respiratory distress.     Breath sounds: Normal breath sounds. No wheezing.  Neurological:     Mental Status: She is alert.      UC Treatments / Results  Labs (all labs ordered are listed, but only abnormal results are displayed) Labs Reviewed  POC COVID19/FLU A&B COMBO    EKG   Radiology No results found.  Procedures Procedures (including critical care time)  Medications Ordered in UC Medications - No data to display  Initial Impression / Assessment and Plan / UC Course  I have reviewed the triage vital signs and the nursing notes.  Pertinent labs & imaging results that were available during my care of the patient were reviewed by me and considered in my medical decision making (see chart for details).    Viral illness.  Rapid COVID and flu negative.  Because patient has asthma and does not currently have an inhaler, albuterol inhaler sent to pharmacy.  Discussed symptomatic treatment including Tylenol or ibuprofen as needed for fever or discomfort, plain Mucinex as needed for congestion, rest, hydration.  Instructed patient to follow-up with PCP if not improving.  ED precautions given.  Patient agrees to plan of care.   Final Clinical Impressions(s) / UC Diagnoses   Final diagnoses:  Viral illness     Discharge Instructions      The COVID and flu tests are negative.   Use the albuterol inhaler as directed.  Take Tylenol or ibuprofen as needed for fever or discomfort.  Take plain Mucinex as needed for congestion.  Rest and keep yourself hydrated.    Follow-up with your primary care provider if your symptoms are not improving.         ED Prescriptions     Medication Sig Dispense Auth. Provider  albuterol (VENTOLIN HFA) 108 (90 Base) MCG/ACT inhaler Inhale 1-2  puffs into the lungs every 6 (six) hours as needed. 18 g Mickie Bail, NP      PDMP not reviewed this encounter.   Mickie Bail, NP 05/07/23 1651    Mickie Bail, NP 05/07/23 1655

## 2023-05-07 NOTE — ED Triage Notes (Signed)
Patient to Urgent Care with complaints of fevers (temp 100.6)/ body aches/ nasal congestion.  Reports symptoms started this morning.

## 2023-05-07 NOTE — Discharge Instructions (Addendum)
The COVID and flu tests are negative.   Use the albuterol inhaler as directed.    Take Tylenol or ibuprofen as needed for fever or discomfort.  Take plain Mucinex as needed for congestion.  Rest and keep yourself hydrated.    Follow-up with your primary care provider if your symptoms are not improving.

## 2023-09-03 ENCOUNTER — Ambulatory Visit: Admitting: Family Medicine

## 2023-09-03 ENCOUNTER — Other Ambulatory Visit (HOSPITAL_COMMUNITY)
Admission: RE | Admit: 2023-09-03 | Discharge: 2023-09-03 | Disposition: A | Discharge status: Home / Self Care | Source: Ambulatory Visit | Attending: Family Medicine | Admitting: Family Medicine

## 2023-09-03 ENCOUNTER — Encounter: Payer: Self-pay | Admitting: Family Medicine

## 2023-09-03 VITALS — BP 114/76 | HR 82 | Wt 189.4 lb

## 2023-09-03 DIAGNOSIS — Z01419 Encounter for gynecological examination (general) (routine) without abnormal findings: Secondary | ICD-10-CM

## 2023-09-03 DIAGNOSIS — Z124 Encounter for screening for malignant neoplasm of cervix: Secondary | ICD-10-CM | POA: Insufficient documentation

## 2023-09-03 NOTE — Progress Notes (Signed)
 Subjective:     Emily Santos is a 28 y.o. female and is here for a comprehensive physical exam. The patient reports no problems.No cycle with IUD.  The following portions of the patient's history were reviewed and updated as appropriate: allergies, current medications, past family history, past medical history, past social history, past surgical history, and problem list.  Review of Systems Pertinent items noted in HPI and remainder of comprehensive ROS otherwise negative.   Objective:  Chaperone present for exam   BP 114/76   Pulse 82   Wt 189 lb 6.4 oz (85.9 kg)   BMI 30.11 kg/m  General appearance: alert, cooperative, and appears stated age Head: Normocephalic, without obvious abnormality, atraumatic Neck: no adenopathy, supple, symmetrical, trachea midline, and thyroid  not enlarged, symmetric, no tenderness/mass/nodules Lungs: clear to auscultation bilaterally Breasts: normal appearance, no masses or tenderness Heart: regular rate and rhythm, S1, S2 normal, no murmur, click, rub or gallop Abdomen: soft, non-tender; bowel sounds normal; no masses,  no organomegaly Pelvic: cervix normal in appearance, external genitalia normal, no adnexal masses or tenderness, no cervical motion tenderness, uterus normal size, shape, and consistency, vagina normal without discharge, and IUD strings noted Extremities: extremities normal, atraumatic, no cyanosis or edema Pulses: 2+ and symmetric Skin: Skin color, texture, turgor normal. No rashes or lesions Lymph nodes: Cervical, supraclavicular, and axillary nodes normal. Neurologic: Grossly normal    Assessment:    Healthy female exam.      Plan:  Screening for malignant neoplasm of cervix - Plan: Cytology - PAP  Encounter for gynecological examination without abnormal finding - Plan: VITAMIN D  25 Hydroxy (Vit-D Deficiency, Fractures), CBC, Comprehensive metabolic panel with GFR, Lipid panel, TSH, Hemoglobin A1c    See After Visit  Summary for Counseling Recommendations

## 2023-09-03 NOTE — Progress Notes (Signed)
 Patient presents for Annual.  LMP: No LMP recorded. (Menstrual status: IUD).  Last pap: Date: 08/14/22 Contraception: IUD: Mirena  Mammogram: Not yet indicated STD Screening: Declines Flu Vaccine : Declines  CC: Annual

## 2023-09-03 NOTE — Patient Instructions (Signed)

## 2023-09-04 ENCOUNTER — Ambulatory Visit: Payer: Self-pay | Admitting: Family Medicine

## 2023-09-04 LAB — COMPREHENSIVE METABOLIC PANEL WITH GFR
ALT: 15 IU/L (ref 0–32)
AST: 20 IU/L (ref 0–40)
Albumin: 4.5 g/dL (ref 4.0–5.0)
Alkaline Phosphatase: 57 IU/L (ref 44–121)
BUN/Creatinine Ratio: 11 (ref 9–23)
BUN: 10 mg/dL (ref 6–20)
Bilirubin Total: 0.3 mg/dL (ref 0.0–1.2)
CO2: 21 mmol/L (ref 20–29)
Calcium: 9.5 mg/dL (ref 8.7–10.2)
Chloride: 102 mmol/L (ref 96–106)
Creatinine, Ser: 0.92 mg/dL (ref 0.57–1.00)
Globulin, Total: 2.3 g/dL (ref 1.5–4.5)
Glucose: 98 mg/dL (ref 70–99)
Potassium: 3.8 mmol/L (ref 3.5–5.2)
Sodium: 141 mmol/L (ref 134–144)
Total Protein: 6.8 g/dL (ref 6.0–8.5)
eGFR: 86 mL/min/{1.73_m2} (ref >59)

## 2023-09-04 LAB — LIPID PANEL
Chol/HDL Ratio: 3 ratio (ref 0.0–4.4)
Cholesterol, Total: 165 mg/dL (ref 100–199)
HDL: 55 mg/dL (ref >39)
LDL Chol Calc (NIH): 96 mg/dL (ref 0–99)
Triglycerides: 76 mg/dL (ref 0–149)
VLDL Cholesterol Cal: 14 mg/dL (ref 5–40)

## 2023-09-04 LAB — HEMOGLOBIN A1C
Est. average glucose Bld gHb Est-mCnc: 94 mg/dL
Hgb A1c MFr Bld: 4.9 % (ref 4.8–5.6)

## 2023-09-04 LAB — CBC
Hematocrit: 41.1 % (ref 34.0–46.6)
Hemoglobin: 13.1 g/dL (ref 11.1–15.9)
MCH: 30.8 pg (ref 26.6–33.0)
MCHC: 31.9 g/dL (ref 31.5–35.7)
MCV: 97 fL (ref 79–97)
Platelets: 308 10*3/uL (ref 150–450)
RBC: 4.25 x10E6/uL (ref 3.77–5.28)
RDW: 12.1 % (ref 11.7–15.4)
WBC: 8.7 10*3/uL (ref 3.4–10.8)

## 2023-09-04 LAB — TSH: TSH: 2.19 u[IU]/mL (ref 0.450–4.500)

## 2023-09-04 LAB — VITAMIN D 25 HYDROXY (VIT D DEFICIENCY, FRACTURES): Vit D, 25-Hydroxy: 20.4 ng/mL — ABNORMAL LOW (ref 30.0–100.0)

## 2023-09-08 LAB — CYTOLOGY - PAP
Adequacy: ABSENT
Diagnosis: NEGATIVE

## 2023-11-14 ENCOUNTER — Ambulatory Visit: Admitting: Nurse Practitioner

## 2023-11-14 ENCOUNTER — Ambulatory Visit: Attending: Nurse Practitioner

## 2023-11-14 ENCOUNTER — Encounter: Payer: Self-pay | Admitting: Nurse Practitioner

## 2023-11-14 VITALS — BP 110/70 | HR 75 | Temp 98.1°F | Ht 66.89 in | Wt 182.2 lb

## 2023-11-14 DIAGNOSIS — R55 Syncope and collapse: Secondary | ICD-10-CM

## 2023-11-14 DIAGNOSIS — J4540 Moderate persistent asthma, uncomplicated: Secondary | ICD-10-CM | POA: Diagnosis not present

## 2023-11-14 DIAGNOSIS — R42 Dizziness and giddiness: Secondary | ICD-10-CM

## 2023-11-14 DIAGNOSIS — G43009 Migraine without aura, not intractable, without status migrainosus: Secondary | ICD-10-CM | POA: Diagnosis not present

## 2023-11-14 LAB — CBC
HCT: 40.7 % (ref 35.0–45.0)
Hemoglobin: 13.1 g/dL (ref 11.7–15.5)
MCH: 30.3 pg (ref 27.0–33.0)
MCHC: 32.2 g/dL (ref 32.0–36.0)
MCV: 94.2 fL (ref 80.0–100.0)
MPV: 11.6 fL (ref 7.5–12.5)
Platelets: 319 Thousand/uL (ref 140–400)
RBC: 4.32 Million/uL (ref 3.80–5.10)
RDW: 11.7 % (ref 11.0–15.0)
WBC: 7.5 Thousand/uL (ref 3.8–10.8)

## 2023-11-14 LAB — BASIC METABOLIC PANEL WITH GFR
BUN: 12 mg/dL (ref 7–25)
CO2: 26 mmol/L (ref 20–32)
Calcium: 9.7 mg/dL (ref 8.6–10.2)
Chloride: 104 mmol/L (ref 98–110)
Creat: 0.89 mg/dL (ref 0.50–0.96)
Glucose, Bld: 99 mg/dL (ref 65–99)
Potassium: 4.1 mmol/L (ref 3.5–5.3)
Sodium: 139 mmol/L (ref 135–146)
eGFR: 90 mL/min/1.73m2 (ref >=60)

## 2023-11-14 NOTE — Assessment & Plan Note (Signed)
 History of tried and failed Emgality  and Ajovy.  Currently on Ubrelvy  for abortive therapy.  Has been seen by neurology but is been extended period of time.  Patient also thinks he has tried Topamax  neurological exam benign given frequency of migraines of 2+ months will refer to neurology referral placed today

## 2023-11-14 NOTE — Progress Notes (Signed)
 New Patient Office Visit  Subjective    Patient ID: Emily Santos, female    DOB: Sep 04, 1995  Age: 28 y.o. MRN: [id number]  CC:  Chief Complaint  Patient presents with   Establish Care    HPI Emily Santos presents to establish care  Migraines: hx of the same. States that she has migraines approx once a week. States that she will get headaches daily. Years ago she saw a neurologist. States that she can tell when a migraine is going to happen. The headache feels different. No aura. States that her migraines are global on the head. Sharp pain. Photosensitivity, phonosensitively. She will get nausea.  Headache is behind the eyes Patietn has tried emgality  and ajovy. She has also trie nurtec. She thinks she has tried topamax  in the past. Not sure about TCAs  Asthma: states that she will use it 1-2 times a week through the summer and during spring a lot with the pollen (daily)  Dizzy spells/presyncope: states that it noticed it last Thursday.he was sitting on the couch and it happened. Last less than a minute. She feels pressure in the head and her ears clogged and her vision tunneled. States that she felt off after. She did check her blood pressure that was fine. She has eaten that day and been drinking fluid States that she felt off that was a fatigue/sensation. States that it has been happening at leaast once a day. And having 3-4 dizzy spells   Tdap:maybe within 10 years. Started in 2021 with a hospital likely up-to-date Flu: seasonal with influenza Covid: original series Hpv: UTD Pna: Too young Shingles: Too young  Pap smear: 09/03/2023, negative  Mammogram: too young  Colonoscopy: too young, currently average risk   Outpatient Encounter Medications as of 11/14/2023  Medication Sig   albuterol  (VENTOLIN  HFA) 108 (90 Base) MCG/ACT inhaler Inhale 1-2 puffs into the lungs every 6 (six) hours as needed.   Ubrogepant  (UBRELVY ) 50 MG TABS Take 50 mg by mouth as needed. (Patient  not taking: Reported on 11/14/2023)   [DISCONTINUED] Erenumab -aooe (AIMOVIG ) 70 MG/ML SOAJ Inject 70 mg into the skin every 30 (thirty) days. (Patient not taking: Reported on 11/14/2023)   No facility-administered encounter medications on file as of 11/14/2023.    Past Medical History:  Diagnosis Date   Anxiety    Asthma    Depression    Headache     Past Surgical History:  Procedure Laterality Date   WISDOM TOOTH EXTRACTION      Family History  Problem Relation Age of Onset   Hyperlipidemia Father    Hypothyroidism Father    Migraines Father    ADD / ADHD Son    Hyperthyroidism Paternal Aunt    Hypertension Paternal Grandmother     Social History   Socioeconomic History   Marital status: Single    Spouse name: Not on file   Number of children: 1   Years of education: Not on file   Highest education level: Associate degree: academic program  Occupational History   Not on file  Tobacco Use   Smoking status: Never   Smokeless tobacco: Never  Vaping Use   Vaping status: Never Used  Substance and Sexual Activity   Alcohol use: Yes    Alcohol/week: 4.0 standard drinks of alcohol    Types: 4 Cans of beer per week    Comment: social events   Drug use: No   Sexual activity: Yes    Birth control/protection:  I.U.D.  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  Respiratory:  Negative for shortness of breath.   Cardiovascular:  Negative for chest pain and leg swelling.  Gastrointestinal:  Negative for abdominal pain, blood in stool, constipation, diarrhea, nausea and  vomiting.       Bm daily   Genitourinary:  Negative for dysuria and hematuria.  Neurological:  Positive for dizziness and headaches. Negative for tingling.  Psychiatric/Behavioral:  Negative for hallucinations and suicidal ideas.         Objective    BP 110/70   Pulse 75   Temp 98.1 F (36.7 C) (Oral)   Ht 5' 6.89 (1.699 m)   Wt 182 lb 3.2 oz (82.6 kg)   SpO2 98%   BMI 28.63 kg/m   Physical Exam Vitals and nursing note reviewed.  Constitutional:      Appearance: Normal appearance.  HENT:     Right Ear: Tympanic membrane, ear canal and external ear normal.     Left Ear: Tympanic membrane, ear canal and external ear normal.     Mouth/Throat:     Mouth: Mucous membranes are moist.     Pharynx: Oropharynx is clear.  Eyes:     Extraocular Movements: Extraocular movements intact.     Pupils: Pupils are equal, round, and reactive to light.  Neck:     Vascular: No carotid bruit.  Cardiovascular:     Rate and Rhythm: Normal rate and regular rhythm.     Pulses: Normal pulses.     Heart sounds: Normal heart sounds.  Pulmonary:     Effort: Pulmonary effort is normal.     Breath sounds: Normal breath sounds.  Abdominal:     General: Bowel sounds are normal. There is no distension.     Palpations: There is no mass.     Tenderness: There is no abdominal tenderness.     Hernia: No hernia is present.  Musculoskeletal:     Right lower leg: No edema.     Left lower leg: No edema.  Lymphadenopathy:     Cervical: No cervical adenopathy.  Skin:    General: Skin is warm.  Neurological:     General: No focal deficit present.     Mental Status: She is alert.     Deep Tendon Reflexes:     Reflex Scores:      Bicep reflexes are 2+ on the right side and 2+ on the left side.      Patellar reflexes are 2+ on the right side and 2+ on the left side.    Comments: Bilateral upper and lower extremity strength 5/5  Psychiatric:        Mood and Affect: Mood normal.        Behavior:  Behavior normal.        Thought Content: Thought content normal.        Judgment: Judgment normal.         Assessment & Plan:   Problem List Items Addressed This  Visit       Cardiovascular and Mediastinum   Migraine without aura, not intractable, without status migrainosus   History of tried and failed Emgality  and Ajovy.  Currently on Ubrelvy  for abortive therapy.  Has been seen by neurology but is been extended period of time.  Patient also thinks he has tried Topamax  neurological exam benign given frequency of migraines of 2+ months will refer to neurology referral placed today      Relevant Orders   Ambulatory referral to Neurology   Pre-syncope   2/2.  Long-term heart monitor check CBC BMP.      Relevant Orders   CBC   Basic metabolic panel with GFR     Respiratory   Moderate persistent asthma without complication   Worse in the spring and summer.  Patient states she does not use it in the fall and winter may need to consider maintenance therapy and peak season stable currently continue albuterol  inhaler as needed        Other   Lightheadedness - Primary   Does not seem orthostatic in nature patient did not have it with position changes.  Unable to elicit in office.  Patient is hydrating appropriately.  Will check CBC and BMP patient recently had labs drawn all within normal limits went.  I did review the ones from GYN.  Will do long-term heart monitor to rule out possible cardiac etiology patient was already referred to neurology.  If patient has a syncopal episode she will be evaluated      Relevant Orders   LONG TERM MONITOR XT (3-14 DAYS)    Return in about 3 months (around 02/14/2024) for migraines/asthma/lightheadedness .   Adina Crandall, NP

## 2023-11-14 NOTE — Assessment & Plan Note (Signed)
 Does not seem orthostatic in nature patient did not have it with position changes.  Unable to elicit in office.  Patient is hydrating appropriately.  Will check CBC and BMP patient recently had labs drawn all within normal limits went.  I did review the ones from GYN.  Will do long-term heart monitor to rule out possible cardiac etiology patient was already referred to neurology.  If patient has a syncopal episode she will be evaluated

## 2023-11-14 NOTE — Assessment & Plan Note (Signed)
 Worse in the spring and summer.  Patient states she does not use it in the fall and winter may need to consider maintenance therapy and peak season stable currently continue albuterol  inhaler as needed

## 2023-11-14 NOTE — Assessment & Plan Note (Signed)
 2/2.  Long-term heart monitor check CBC BMP.

## 2023-11-14 NOTE — Patient Instructions (Addendum)
 Nice to see you today I will be in touch with the labs once I have them  Follow up with me in 3 months sooner if you need me  I have referred you to neurology and have ordered for a heart monitor to be sent to your home

## 2023-11-18 ENCOUNTER — Ambulatory Visit: Payer: Self-pay | Admitting: Nurse Practitioner

## 2023-11-18 ENCOUNTER — Encounter: Payer: Self-pay | Admitting: Neurology

## 2023-12-10 DIAGNOSIS — R42 Dizziness and giddiness: Secondary | ICD-10-CM | POA: Diagnosis not present

## 2023-12-13 DIAGNOSIS — R42 Dizziness and giddiness: Secondary | ICD-10-CM | POA: Diagnosis not present

## 2024-02-17 ENCOUNTER — Ambulatory Visit: Admitting: Nurse Practitioner

## 2024-02-17 VITALS — BP 110/80 | HR 70 | Temp 98.3°F | Ht 66.89 in | Wt 185.4 lb

## 2024-02-17 DIAGNOSIS — J4540 Moderate persistent asthma, uncomplicated: Secondary | ICD-10-CM | POA: Diagnosis not present

## 2024-02-17 DIAGNOSIS — G43009 Migraine without aura, not intractable, without status migrainosus: Secondary | ICD-10-CM

## 2024-02-17 DIAGNOSIS — R42 Dizziness and giddiness: Secondary | ICD-10-CM | POA: Diagnosis not present

## 2024-02-17 MED ORDER — DEXAMETHASONE SOD PHOSPHATE PF 10 MG/ML IJ SOLN
10.0000 mg | Freq: Once | INTRAMUSCULAR | Status: AC
  Administered 2024-02-17: 10 mg via INTRAMUSCULAR

## 2024-02-17 MED ORDER — KETOROLAC TROMETHAMINE 60 MG/2ML IM SOLN
60.0000 mg | Freq: Once | INTRAMUSCULAR | Status: AC
  Administered 2024-02-17: 60 mg via INTRAMUSCULAR

## 2024-02-17 NOTE — Patient Instructions (Signed)
 Nice to see you today We did give you Toradol  and dexamethasone in office Avoid NSAIDS like Ibuprofen , Motrin , Aleve, Naproxen, BC/Goody powders for 12 hours Tylenol  and your ubrelvy  are ok to take if needed  Follow up with me in  approx 6 months

## 2024-02-17 NOTE — Progress Notes (Signed)
 Established Patient Office Visit  Subjective   Patient ID: Emily Santos, female    DOB: 1995/11/21  Age: 28 y.o. MRN: [id number]  Chief Complaint  Patient presents with   Follow-up    Pt complains of still having migraines. Most recent one lasted for 8 hours. The lightheadedness has slowed down. States its not as consistent.     HPI  Discussed the use of AI scribe software for clinical note transcription with the patient, who gave verbal consent to proceed.  History of Present Illness Emily Santos is a 28 year old female with migraines who presents for follow-up on headache management.  She experiences migraines that have been persistent despite trying various treatments including Emgality , Ajovy, and topiramate , which were ineffective. She currently uses Ubrelvy  as needed for abortive therapy. A recent migraine began on Thursday, waking her up and lasting approximately eight hours before subsiding to a dull headache. The pain was primarily located behind her left eye and, if it worsens, spreads across her head. She managed the pain with ibuprofen , hydration, and resting in a dark room. As of today, she rates the headache as a 2 out of 10 in severity, describing it as 'just annoying'.  She also experiences episodes of lightheadedness, which have become less frequent and are no longer occurring daily. 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  She did undergo a long-term at home heart monitor that was benign.  Regarding her asthma, it is worse in the spring and summer but not as problematic in the fall and winter. She used albuterol  over the weekend due to sinus-related issues but has not needed it otherwise recently.  She has an IUD and does not experience menstrual periods. She reports an allergy to sulfa medications. She received a flu shot at work and experienced transient tingling in her  fingers post-vaccination, which resolved after a week.     Review of Systems  Constitutional:  Negative for chills and fever.  Eyes:  Negative for blurred vision and double vision.  Respiratory:  Negative for shortness of breath.   Cardiovascular:  Negative for chest pain.  Neurological:  Positive for dizziness and tingling (resolving form flu vaccine). Negative for headaches.      Objective:     BP 110/80   Pulse 70   Temp 98.3 F (36.8 C) (Oral)   Ht 5' 6.89 (1.699 m)   Wt 185 lb 6.4 oz (84.1 kg)   SpO2 99%   BMI 29.13 kg/m  BP Readings from Last 3 Encounters:  02/17/24 110/80  11/14/23 110/70  09/03/23 114/76   Wt Readings from Last 3 Encounters:  02/17/24 185 lb 6.4 oz (84.1 kg)  11/14/23 182 lb 3.2 oz (82.6 kg)  09/03/23 189 lb 6.4 oz (85.9 kg)   SpO2 Readings from Last 3 Encounters:  02/17/24 99%  11/14/23 98%  05/07/23 97%      Physical Exam Vitals and nursing note reviewed.  Constitutional:      Appearance: Normal appearance.  Eyes:     Extraocular Movements: Extraocular movements intact.     Pupils: Pupils are equal, round, and reactive to light.  Cardiovascular:     Rate and Rhythm: Normal rate and regular rhythm.     Heart sounds: Normal heart sounds.  Pulmonary:     Effort: Pulmonary effort is normal.     Breath sounds: Normal breath sounds.  Neurological:     General: No focal deficit present.     Mental Status: She is alert.     Deep Tendon Reflexes:     Reflex Scores:      Bicep reflexes are 2+ on the right side and 2+ on the left side.      Patellar reflexes are 2+ on the right side and 2+ on the left side.    Comments: Bilateral upper and lower extremity strength 5/5      No results found for any visits on 02/17/24.    The ASCVD Risk score (Arnett DK, et al., 2019) failed to calculate for the following reasons:   The 2019 ASCVD risk score is only valid for ages 62 to 39    Assessment & Plan:   Problem List Items Addressed  This Visit       Cardiovascular and Mediastinum   Migraine without aura, not intractable, without status migrainosus - Primary     Respiratory   Moderate persistent asthma without complication     Other   Lightheadedness  Assessment and Plan Assessment & Plan Migraine without aura, not intractable, without status migrainosus Chronic migraines with recent exacerbation. Previous treatments ineffective. Current abortive therapy with Ubrelvy  and ibuprofen . Neurology appointment scheduled for December. - Administered Toradol  60 mg intramuscularly. - Administered dexamethasone 10 mg intramuscularly. - Advised no NSAIDs for 12 hours post-Toradol . - Continue Ubrelvy  as needed. Can also use tylenol  if needed - Follow up with neurology in December.  Moderate persistent asthma, uncomplicated Asthma symptoms worse in spring and summer, improved in fall and winter. Recent albuterol  use due to sinus issues. No recent exacerbations. - Continue albuterol  as needed. - Monitor for seasonal exacerbations, consider controller inhaler if symptoms worsen.  Lightheadedness Intermittent lightheadedness with decreased frequency. Previous heart monitor normal. Symptoms less frequent. - Advised hydration and regular meals. - No current need for cardiology referral.   Return in about 7 months (around 09/16/2024) for CPE and Labs.    Adina Crandall, NP

## 2024-03-19 NOTE — Progress Notes (Signed)
 NEUROLOGY CONSULTATION NOTE  Emily Santos MRN: [id number] DOB: January 21, 1996  Referring provider: Lynwood Crandall, NP Primary care provider: Lynwood Crandall, NP  Reason for consult:  migraine  Assessment/Plan:   Chronic migraine without aura, without status migrainosus, not intractable Dizziness - unclear etiology but migraine is possible.   Migraine prevention:  Plan to start Botox Migraine rescue:  Will have her try 2 options and she will update me Increased dose of Ubrelvy  100mg  Symbravo Zofran -ODT 4mg  for nausea Lifestyle modification: Limit use of pain relievers to no more than 9 days out of the month to prevent risk of rebound or medication-overuse headache. Diet modification/hydration/caffeine cessation Routine exercise Sleep hygiene Consider vitamins/supplements:  magnesium citrate 400mg  daily, riboflavin 400mg  daily, CoQ10 100mg  three times daily Keep headache diary Follow up 6 months.    Subjective:  Emily Santos is a 28 year old right-handed female who presents for migraines.  History supplemented by prior neurologist's and referring provider's notes.  MRI personally reviewed.  Headaches: Onset:  childhood.  2 headaches.  Became frequent after she had her son when she was 2 years old. 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  Aura:  absent Prodrome:  absent Postdrome:  2) drained until the next day Associated symptoms:  1) rarely nausea, photophobia, phonophbia; 2) nausea, vomiting, photophobia, phonophobia.  She denies associated unilateral numbness or weakness. Duration:  1)  several hours to 3-4 days with ibuprofen  and Ubrelvy  50mg ; 2) several hours to one day Frequency:  1) daily; 2) one day a week Frequency of abortive medication: takes ibuprofen  3 to 4 days a week on  average Triggers:  1) poor sleep/variable sleep schedule; 2) unsure Relieving factors:  hydration, resting in dark room, vomiting Activity:  aggravates.  Cannot function with headache 2.  Dizziness: She started getting dizziness around August or September.  First time it occurred spontaneously while sitting on the couch watching TV.  Described as lightheadedness.  Developed pressure in head, aural fullness, tunnel vision.  Lasted 30 seconds.  Afterwards, felt lethargic.  No headache.  Started occurring 3 to 4 times daily for 3 week period.  Then it tapered off.  Still occurs about 2 to 3 times a week.  Not positional or triggered by anything.  Cardiac event monitor was unremarkable.  02/21/2016 MRI BRAIN W WO:  Unremarkable MRI brain (with and without). No acute findings. Minimal downward cerebellar tonsillar ectopia (2-36mm) noted.  02/21/2016 MRI ORBITS W WO:  Normal MRI orbits (with and without).   Past NSAIDS/analgesics:  Excedrin Migraine, acetaminophen , diclofenac  75mg  Past abortive triptans:  sumatriptan  100mg , eletriptan , rizatriptan  Past abortive ergotamine:  none Past muscle relaxants:  cyclobenzaprine  Past anti-emetic:  ondansetron  Past antihypertensive medications:  none Past antidepressant medications:  nortriptyline , sertraline  Past anticonvulsant medications:  topiramate , lamotrigine  Past anti-CGRP:  Emgality , Aimovig , Nurtec, Zavzpret Past vitamins/Herbal/Supplements:  magnesium Past antihistamines/decongestants:  diphenhydramine , cetirizine Other past therapies:  none  Current NSAIDS/analgesics:  ibuprofen  Current triptans:  none Current ergotamine:  none Current anti-emetic:  none Current muscle relaxants:  none Current Antihypertensive medications:  none Current Antidepressant medications:  none Current Anticonvulsant medications:  none Current anti-CGRP:  Ubrelvy  50mg  Current Vitamins/Herbal/Supplements:  none Current Antihistamines/Decongestants:  none Other  therapy:  none Birth control:  Mirena     Caffeine:  16 oz coffee  daily.  Rarely soda.   Alcohol:  rarely Smoker:  no Diet:  protein shake, water Exercise:  varies Depression/Anxiety:  Bipolar depression.  Has anxiety Sleep hygiene:  variable.  Works night-shift in the NICU, which impedes sleep  History of TBI/concussion:  no Family history of headache:  father.  Mother's side unknown Family history of cerebral aneurysm:  no      PAST MEDICAL HISTORY: Past Medical History:  Diagnosis Date   Anxiety    Asthma    Depression    Headache     PAST SURGICAL HISTORY: Past Surgical History:  Procedure Laterality Date   WISDOM TOOTH EXTRACTION      MEDICATIONS: Medications Ordered Prior to Encounter[1]  ALLERGIES: Allergies[2]  FAMILY HISTORY: Family History  Problem Relation Age of Onset   Hyperlipidemia Father    Hypothyroidism Father    Migraines Father    ADD / ADHD Son    Hyperthyroidism Paternal Aunt    Hypertension Paternal Grandmother     Objective:  Blood pressure 104/69, pulse 100, height 5' 6 (1.676 m), weight 185 lb 9.6 oz (84.2 kg), SpO2 100%. General: No acute distress.  Patient appears well-groomed.   Head:  Normocephalic/atraumatic Eyes:  fundi examined but not visualized Neck: supple, no paraspinal tenderness, full range of motion Heart: regular rate and rhythm Neurological Exam: Mental status: alert and oriented to person, place, and time, speech fluent and not dysarthric, language intact. Cranial nerves: CN I: not tested CN II: pupils equal, round and reactive to light, visual fields intact CN III, IV, VI:  full range of motion, no nystagmus, no ptosis CN V: facial sensation intact. CN VII: upper and lower face symmetric CN VIII: hearing intact CN IX, X: gag intact, uvula midline CN XI: sternocleidomastoid and trapezius muscles intact CN XII: tongue midline Bulk & Tone: normal, no fasciculations. Motor:  muscle strength 5/5  throughout Sensation:  Pinprick and vibratory sensation intact. Deep Tendon Reflexes:  2+ throughout,  toes downgoing.   Finger to nose testing:  Without dysmetria.   Gait:  Normal station and stride.  Romberg negative.    Thank you for allowing me to take part in the care of this patient.  Juliene Dunnings, DO  CC: Lynwood Crandall, NP        [1]  Current Outpatient Medications on File Prior to Visit  Medication Sig Dispense Refill   albuterol  (VENTOLIN  HFA) 108 (90 Base) MCG/ACT inhaler Inhale 1-2 puffs into the lungs every 6 (six) hours as needed. 6.7 g 0   Ubrogepant  (UBRELVY ) 50 MG TABS Take 50 mg by mouth as needed. 10 tablet 11   No current facility-administered medications on file prior to visit.  [2]  Allergies Allergen Reactions   Sulfa Antibiotics Rash

## 2024-03-22 ENCOUNTER — Other Ambulatory Visit: Payer: Self-pay

## 2024-03-22 ENCOUNTER — Encounter: Payer: Self-pay | Admitting: Neurology

## 2024-03-22 ENCOUNTER — Ambulatory Visit: Admitting: Neurology

## 2024-03-22 ENCOUNTER — Telehealth: Payer: Self-pay

## 2024-03-22 VITALS — BP 104/69 | HR 100 | Ht 66.0 in | Wt 185.6 lb

## 2024-03-22 DIAGNOSIS — R42 Dizziness and giddiness: Secondary | ICD-10-CM

## 2024-03-22 DIAGNOSIS — G43709 Chronic migraine without aura, not intractable, without status migrainosus: Secondary | ICD-10-CM | POA: Diagnosis not present

## 2024-03-22 MED ORDER — ONDANSETRON 4 MG PO TBDP
4.0000 mg | ORAL_TABLET | Freq: Three times a day (TID) | ORAL | 5 refills | Status: AC | PRN
  Filled 2024-03-22 – 2024-04-27 (×2): qty 20, 7d supply, fill #0

## 2024-03-22 NOTE — Patient Instructions (Signed)
°  Plan to start BOTOX I will have you try 2 different rescue meds: Take UBRELVY  100MG  at earliest onset of headache.  May repeat dose once in 2 hours if needed.  Maximum 2 tablets in 24 hours. Alternatively, take SYMBRAVO 1 tablet in 24 hours. Let me know which is more effective Limit use of pain relievers to no more than 9 days out of the month.  These medications include acetaminophen , NSAIDs (ibuprofen /Advil /Motrin , naproxen/Aleve, triptans (Imitrex /sumatriptan ), Excedrin, and narcotics.  This will help reduce risk of rebound headaches. Be aware of common food triggers:  - Caffeine:  coffee, black tea, cola, Mt. Dew  - Chocolate  - Dairy:  aged cheeses (brie, blue, cheddar, gouda, Parmasan, provolone, romano, Swiss, etc), chocolate milk, buttermilk, sour cream, limit eggs and yogurt  - Nuts, peanut butter  - Alcohol  - Cereals/grains:  FRESH breads (fresh bagels, sourdough, doughnuts), yeast productions  - Processed/canned/aged/cured meats (pre-packaged deli meats, hotdogs)  - MSG/glutamate:  soy sauce, flavor enhancer, pickled/preserved/marinated foods  - Sweeteners:  aspartame (Equal, Nutrasweet).  Sugar and Splenda are okay  - Vegetables:  legumes (lima beans, lentils, snow peas, fava beans, pinto peans, peas, garbanzo beans), sauerkraut, onions, olives, pickles  - Fruit:  avocados, bananas, citrus fruit (orange, lemon, grapefruit), mango  - Other:  Frozen meals, macaroni and cheese Routine exercise Stay adequately hydrated (aim for 64 oz water daily) Keep headache diary Maintain proper stress management Maintain proper sleep hygiene Do not skip meals Consider supplements:  magnesium citrate 400mg  daily, riboflavin 400mg  daily, coenzyme Q10 100mg  three times daily.

## 2024-03-22 NOTE — Progress Notes (Signed)
 Medication Samples have been provided to the patient.  Drug name: Ubrelvy        Strength: 100 mg        Qty: 4  LOT: [id number]  Exp.Date: 4/28  Dosing instructions: as need  The patient has been instructed regarding the correct time, dose, and frequency of taking this medication, including desired effects and most common side effects.   Emily Santos 8:55 AM 03/22/2024    Medication Samples have been provided to the patient.  Drug name: Symbravo       Strength: 10 mg        Qty: 1  LOT: [id number]  Exp.Date: 2/26  Dosing instructions: as needed  The patient has been instructed regarding the correct time, dose, and frequency of taking this medication, including desired effects and most common side effects.   Emily Santos 8:56 AM 03/22/2024

## 2024-03-22 NOTE — Telephone Encounter (Signed)
 Patient seen in office today. Plan to start Botox 200 units every 90 days.    PA team please start PA for Botox 200 units

## 2024-04-02 ENCOUNTER — Other Ambulatory Visit: Payer: Self-pay

## 2024-04-05 ENCOUNTER — Other Ambulatory Visit (HOSPITAL_COMMUNITY): Payer: Self-pay

## 2024-04-05 ENCOUNTER — Telehealth: Payer: Self-pay | Admitting: Pharmacy Technician

## 2024-04-05 NOTE — Telephone Encounter (Signed)
 Pharmacy Patient Advocate Encounter   Received notification from Pt Calls Messages that prior authorization for BOTOX 200 is required/requested.   Insurance verification completed.   The patient is insured through [facility].   Per test claim: PA required; PA submitted to above mentioned insurance via Latent Key/confirmation #/EOC [id number] Status is pending

## 2024-04-05 NOTE — Telephone Encounter (Signed)
 PA has been submitted, and telephone encounter has been created. Please see telephone encounter dated 12.29.25.

## 2024-04-06 NOTE — Telephone Encounter (Signed)
 Pharmacy Patient Advocate Encounter  Received notification from [facility] that Prior Authorization for BOTOX 200 has been DENIED.  Full denial letter will be uploaded to the media tab. See denial reason below.    PA #/Case ID/Reference #: [id number]

## 2024-04-09 NOTE — Telephone Encounter (Signed)
 Information has been sent to clinical pharmacist for appeals review. It may take 5-7 days to prepare the necessary documentation to request the appeal from the insurance.

## 2024-04-09 NOTE — Telephone Encounter (Signed)
 LMOVM and mychart message sent to patient.

## 2024-04-13 ENCOUNTER — Other Ambulatory Visit: Payer: Self-pay

## 2024-04-13 ENCOUNTER — Telehealth: Payer: Self-pay | Admitting: Neurology

## 2024-04-13 ENCOUNTER — Telehealth: Payer: Self-pay | Admitting: Pharmacist

## 2024-04-13 ENCOUNTER — Other Ambulatory Visit: Payer: Self-pay | Admitting: Neurology

## 2024-04-13 ENCOUNTER — Other Ambulatory Visit (HOSPITAL_COMMUNITY): Payer: Self-pay

## 2024-04-13 MED ORDER — UBRELVY 100 MG PO TABS
1.0000 | ORAL_TABLET | ORAL | 5 refills | Status: AC | PRN
  Filled 2024-04-13 – 2024-04-27 (×2): qty 10, 30d supply, fill #0

## 2024-04-13 NOTE — Telephone Encounter (Signed)
 An E-Appeal has been submitted. For Botox Will advise when response is received, please be advised that most companies may take 30 days to make a decision. Appeal letter was uploaded and submitted via [employer] website on 04/13/2024 @1 :06 pm.    Thank you, Devere Pandy, PharmD Clinical Pharmacist  [facility]  Direct Dial: [phone]

## 2024-04-13 NOTE — Telephone Encounter (Signed)
 See my chart message

## 2024-04-13 NOTE — Telephone Encounter (Signed)
 SABRA

## 2024-04-13 NOTE — Telephone Encounter (Signed)
 [employer] Call ID: [id number]  [facility]: [phone]  Caller states that she got a message about her Botox  being denied. She is also returning a call to the office. She needs to also get more migraine samples and a Rx called in.

## 2024-04-15 ENCOUNTER — Other Ambulatory Visit: Payer: Self-pay

## 2024-04-15 ENCOUNTER — Telehealth: Payer: Self-pay | Admitting: Pharmacy Technician

## 2024-04-15 ENCOUNTER — Other Ambulatory Visit (HOSPITAL_COMMUNITY): Payer: Self-pay

## 2024-04-15 MED ORDER — ONABOTULINUMTOXINA 200 UNITS IJ SOLR
INTRAMUSCULAR | 4 refills | Status: AC
  Filled 2024-04-15: qty 1, fill #0
  Filled 2024-04-16: qty 1, 30d supply, fill #0

## 2024-04-15 NOTE — Telephone Encounter (Signed)
 Appeal for Botox  has been approved by the insurance, full letter can be found under the media tab.    Thank you, Devere Pandy, PharmD Clinical Pharmacist  [facility]  Direct Dial: [phone]

## 2024-04-15 NOTE — Telephone Encounter (Signed)
 SABRA

## 2024-04-15 NOTE — Telephone Encounter (Signed)
 Yes, it can be filled with [facility].

## 2024-04-15 NOTE — Progress Notes (Signed)
 Patient to be enrolled with [facility]. Routed to Rx Prior Auth Team (ATTN: [place]).

## 2024-04-16 ENCOUNTER — Other Ambulatory Visit: Payer: Self-pay

## 2024-04-16 ENCOUNTER — Other Ambulatory Visit (HOSPITAL_COMMUNITY): Payer: Self-pay

## 2024-04-16 ENCOUNTER — Other Ambulatory Visit: Payer: Self-pay | Admitting: Pharmacy Technician

## 2024-04-16 ENCOUNTER — Telehealth: Payer: Self-pay | Admitting: Neurology

## 2024-04-16 NOTE — Progress Notes (Signed)
 Initial fill has been scheduled in [place].

## 2024-04-16 NOTE — Telephone Encounter (Signed)
 Pt called in this morning  and she stated that she is returning a call. Thanks

## 2024-04-16 NOTE — Progress Notes (Signed)
 Specialty Pharmacy Initial Fill Coordination Note  Emily Santos is a 29 y.o. female contacted today regarding initial fill of specialty medication(s) OnabotulinumtoxinA  (BOTOX )   Patient requested Courier to Provider Office   Delivery date: 04/21/24   Verified address: [facility]  [address] [place], [place], [address]   Medication will be filled on 1.13.26.   Patient is aware of 0 copayment.

## 2024-04-16 NOTE — Telephone Encounter (Signed)
 Advised of botox  approved and needs to call [facility] to schedule delivery.   May call back to reschedule for a sooner appt.

## 2024-04-19 NOTE — Progress Notes (Signed)
 On boarding has been completed

## 2024-04-20 ENCOUNTER — Other Ambulatory Visit: Payer: Self-pay

## 2024-04-27 ENCOUNTER — Other Ambulatory Visit: Payer: Self-pay

## 2024-04-30 ENCOUNTER — Ambulatory Visit: Admitting: Neurology

## 2024-04-30 DIAGNOSIS — G43709 Chronic migraine without aura, not intractable, without status migrainosus: Secondary | ICD-10-CM

## 2024-04-30 MED ORDER — ONABOTULINUMTOXINA 100 UNITS IJ SOLR
200.0000 [IU] | Freq: Once | INTRAMUSCULAR | Status: AC
  Administered 2024-04-30: 155 [IU] via INTRAMUSCULAR

## 2024-04-30 NOTE — Progress Notes (Signed)

## 2024-05-13 ENCOUNTER — Other Ambulatory Visit (HOSPITAL_COMMUNITY): Payer: Self-pay

## 2024-05-21 ENCOUNTER — Ambulatory Visit: Payer: Self-pay | Admitting: Neurology

## 2024-07-30 ENCOUNTER — Ambulatory Visit: Payer: Self-pay | Admitting: Neurology

## 2024-09-16 ENCOUNTER — Encounter: Admitting: Nurse Practitioner

## 2024-11-22 ENCOUNTER — Ambulatory Visit: Payer: Self-pay | Admitting: Neurology
